# Patient Record
Sex: Female | Born: 1965
Health system: Southern US, Community
[De-identification: ages and names within clinical notes are randomized; demographics above are authoritative.]

## PROBLEM LIST (undated history)

## (undated) DIAGNOSIS — R011 Cardiac murmur, unspecified: Secondary | ICD-10-CM

## (undated) DIAGNOSIS — B029 Zoster without complications: Secondary | ICD-10-CM

## (undated) DIAGNOSIS — Z923 Personal history of irradiation: Secondary | ICD-10-CM

## (undated) DIAGNOSIS — M1990 Unspecified osteoarthritis, unspecified site: Secondary | ICD-10-CM

## (undated) DIAGNOSIS — C50911 Malignant neoplasm of unspecified site of right female breast: Secondary | ICD-10-CM

## (undated) DIAGNOSIS — Z87442 Personal history of urinary calculi: Secondary | ICD-10-CM

## (undated) DIAGNOSIS — D0511 Intraductal carcinoma in situ of right breast: Principal | ICD-10-CM

## (undated) DIAGNOSIS — I341 Nonrheumatic mitral (valve) prolapse: Secondary | ICD-10-CM

## (undated) HISTORY — DX: Intraductal carcinoma in situ of right breast: D05.11

## (undated) HISTORY — PX: WISDOM TOOTH EXTRACTION: SHX21

## (undated) HISTORY — PX: BREAST LUMPECTOMY: SHX2

## (undated) HISTORY — DX: Zoster without complications: B02.9

---

## 2004-09-01 ENCOUNTER — Encounter: Payer: Self-pay | Admitting: Internal Medicine

## 2006-07-18 ENCOUNTER — Encounter: Payer: Self-pay | Admitting: Internal Medicine

## 2006-08-18 ENCOUNTER — Encounter: Payer: Self-pay | Admitting: Internal Medicine

## 2007-06-15 ENCOUNTER — Encounter: Payer: Self-pay | Admitting: Internal Medicine

## 2007-06-16 ENCOUNTER — Encounter: Payer: Self-pay | Admitting: Internal Medicine

## 2007-10-09 ENCOUNTER — Ambulatory Visit: Payer: Self-pay | Admitting: Internal Medicine

## 2007-10-09 DIAGNOSIS — M8580 Other specified disorders of bone density and structure, unspecified site: Secondary | ICD-10-CM

## 2007-10-09 DIAGNOSIS — R109 Unspecified abdominal pain: Secondary | ICD-10-CM

## 2007-10-10 ENCOUNTER — Encounter: Admission: RE | Admit: 2007-10-10 | Discharge: 2007-10-10 | Payer: Self-pay | Admitting: Internal Medicine

## 2008-11-18 ENCOUNTER — Ambulatory Visit: Payer: Self-pay | Admitting: Internal Medicine

## 2008-11-18 DIAGNOSIS — L255 Unspecified contact dermatitis due to plants, except food: Secondary | ICD-10-CM

## 2008-11-18 LAB — CONVERTED CEMR LAB: Pap Smear: NORMAL

## 2008-11-18 LAB — HM PAP SMEAR: HM Pap smear: NORMAL

## 2009-02-21 ENCOUNTER — Ambulatory Visit: Payer: Self-pay | Admitting: Internal Medicine

## 2009-02-21 DIAGNOSIS — B354 Tinea corporis: Secondary | ICD-10-CM | POA: Insufficient documentation

## 2009-03-12 ENCOUNTER — Ambulatory Visit: Payer: Self-pay | Admitting: Internal Medicine

## 2009-04-04 ENCOUNTER — Ambulatory Visit: Payer: Self-pay | Admitting: Internal Medicine

## 2009-04-04 DIAGNOSIS — F329 Major depressive disorder, single episode, unspecified: Secondary | ICD-10-CM

## 2009-05-22 ENCOUNTER — Ambulatory Visit: Payer: Self-pay | Admitting: Internal Medicine

## 2009-05-26 ENCOUNTER — Telehealth: Payer: Self-pay | Admitting: Internal Medicine

## 2009-05-27 ENCOUNTER — Encounter: Payer: Self-pay | Admitting: Internal Medicine

## 2009-05-28 ENCOUNTER — Telehealth: Payer: Self-pay | Admitting: Internal Medicine

## 2009-07-02 ENCOUNTER — Telehealth: Payer: Self-pay | Admitting: Internal Medicine

## 2009-07-23 ENCOUNTER — Emergency Department (HOSPITAL_COMMUNITY): Admission: EM | Admit: 2009-07-23 | Discharge: 2009-07-24 | Payer: Self-pay | Admitting: Emergency Medicine

## 2009-07-24 ENCOUNTER — Encounter: Payer: Self-pay | Admitting: Internal Medicine

## 2009-10-23 ENCOUNTER — Ambulatory Visit: Payer: Self-pay | Admitting: Internal Medicine

## 2010-01-20 ENCOUNTER — Encounter: Payer: Self-pay | Admitting: Internal Medicine

## 2010-02-04 ENCOUNTER — Encounter: Payer: Self-pay | Admitting: Internal Medicine

## 2010-04-21 NOTE — Assessment & Plan Note (Signed)
Summary: 4-6 WK FU--D/T---STC   Vital Signs:  Patient profile:   45 year old female Height:      68 inches (172.72 cm) Weight:      140.0 pounds (63.64 kg) O2 Sat:      96 % on Room air Temp:     99.0 degrees F (37.22 degrees C) oral Pulse rate:   74 / minute BP sitting:   118 / 88  (left arm) Cuff size:   regular  Vitals Entered By: Orlan Leavens (May 22, 2009 8:24 AM)  O2 Flow:  Room air CC: 4 week f/u  Is Patient Diabetic? No Pain Assessment Patient in pain? no        Primary Care Provider:  Newt Lukes MD  CC:  4 week f/u .  History of Present Illness: here for followup depression - feels better - less irritability tol effexor well -  no hot flashed since starting med!  Current Medications (verified): 1)  Lotrisone 1-0.05 % Crea (Clotrimazole-Betamethasone) .... Apply Two Times A Day To Affected Area 2)  Effexor Xr 37.5 Mg Xr24h-Cap (Venlafaxine Hcl) .Marland Kitchen.. 1 By Mouth Once Daily X 2 Weeks, Then 2 Caps Once Daily or As Directed  Allergies (verified): No Known Drug Allergies  Past History:  Past Medical History: Osteopenia menopausal syndrome history of MVP, and palpitations depression  Review of Systems  The patient denies chest pain and headaches.    Physical Exam  General:  alert, well-developed, well-nourished, and cooperative to examination.    Psych:  Oriented X3, memory intact for recent and remote, normally interactive, good eye contact, and not anxious appearing.  brighter affect than last visit, less depressed, not tearful   Impression & Recommendations:  Problem # 1:  DEPRESSION (ICD-311) Assessment Improved cont same - has also improved meopausal syndrome symptoms  discussion re: eventual taper - anticipate in next 4-6 weeks so cont low dose cap in anticipation of taper Her updated medication list for this problem includes:    Effexor Xr 37.5 Mg Xr24h-cap (Venlafaxine hcl) .Marland Kitchen... 2 caps once daily or as directed for  taper  Complete Medication List: 1)  Lotrisone 1-0.05 % Crea (Clotrimazole-betamethasone) .... Apply two times a day to affected area 2)  Effexor Xr 37.5 Mg Xr24h-cap (Venlafaxine hcl) .... 2 caps once daily or as directed for taper  Patient Instructions: 1)  it was good to see you today.  2)  continue medication as we discussed -refills sent - Prescriptions: EFFEXOR XR 37.5 MG XR24H-CAP (VENLAFAXINE HCL) 2 caps once daily or as directed for taper  #60 x 2   Entered and Authorized by:   Newt Lukes MD   Signed by:   Newt Lukes MD on 05/22/2009   Method used:   Electronically to        [facility] [address] [phone]* (retail)       [address]       [place], [place]  [address]       Ph: [phone] or [phone]       Fax: [phone]   RxID:   [id number]

## 2010-04-21 NOTE — Letter (Signed)
[facility]   Imported By: Sherian Rein 02/10/2010 11:27:24  _____________________________________________________________________  External Attachment:    Type:   Image     Comment:   External Document

## 2010-04-21 NOTE — Letter (Signed)
Summary: [facility]   Imported By: Sherian Rein 01/28/2010 09:58:14  _____________________________________________________________________  External Attachment:    Type:   Image     Comment:   External Document

## 2010-04-21 NOTE — Letter (Signed)
Summary: [facility]   Imported By: Sherian Rein 07/29/2009 13:38:57  _____________________________________________________________________  External Attachment:    Type:   Image     Comment:   External Document

## 2010-04-21 NOTE — Progress Notes (Signed)
Summary: Poison Ivy  Phone Note Call from Patient Call back at Work Phone [phone]   Caller: Patient Summary of Call: pt called stating that she has poison ivy rash on arms and hands. pt is requesting Rx for oral steroids to pharmacy on file. Initial call taken by: Margaret Pyle, CMA,  July 02, 2009 8:52 AM  Follow-up for Phone Call        ok - [facility] Follow-up by: Newt Lukes MD,  July 02, 2009 9:13 AM  Additional Follow-up for Phone Call Additional follow up Details #1::        pt informed via personal VM Additional Follow-up by: Margaret Pyle, CMA,  July 02, 2009 9:32 AM    New/Updated Medications: PREDNISONE (PAK) 10 MG TABS (PREDNISONE) as directed x 6 days Prescriptions: PREDNISONE (PAK) 10 MG TABS (PREDNISONE) as directed x 6 days  #1 x 1   Entered and Authorized by:   Newt Lukes MD   Signed by:   Newt Lukes MD on 07/02/2009   Method used:   Electronically to        [facility] [address] [phone]* (retail)       [address]       [place], [place]  [address]       Ph: [phone] or [phone]       Fax: [phone]   RxID:   [id number]

## 2010-04-21 NOTE — Assessment & Plan Note (Signed)
Summary: "private" matter/will discuss w/dr leshcber/cd   Vital Signs:  Patient profile:   45 year old female Height:      68 inches (172.72 cm) Weight:      144.0 pounds (65.45 kg) O2 Sat:      96 % on Room air Temp:     98.3 degrees F (36.83 degrees C) oral Pulse rate:   67 / minute BP sitting:   110 / 82  (left arm) Cuff size:   regular  Vitals Entered By: Orlan Leavens RMA (October 23, 2009 9:10 AM)  O2 Flow:  Room air CC: Discuss privates iissue with md Is Patient Diabetic? No Pain Assessment Patient in pain? no        Primary Care Provider:  Newt Lukes MD  CC:  Discuss privates iissue with md.  History of Present Illness: here for c/o itch and burn- onset weeks ago-   Current Medications (verified): 1)  None  Allergies (verified): No Known Drug Allergies  Past History:  Past Medical History: Osteopenia menopausal syndrome history of MVP, and palpitations depression kidney stone 07/2009  Md roster: uro - gyn -sylva  Review of Systems  The patient denies fever, weight loss, and hematochezia.    Physical Exam  General:  alert, well-developed, well-nourished, and cooperative to examination.    Mouth:  pharynx pink and moist.   Rectal:  no external abnormalities, no hemorrhoids, normal sphincter tone, and no masses.   Skin:  skin tear above anus, candida changes Psych:  Oriented X3, memory intact for recent and remote, normally interactive, good eye contact, and not anxious appearing.  not depressed, not tearful   Impression & Recommendations:  Problem # 1:  TINEA CORPORIS (ICD-110.5)  add nystatin powder and use lotrisone (left from prior rx) as needed   Orders: Prescription Created Electronically [phone])  Complete Medication List: 1)  Nystop 100000 Unit/gm Powd (Nystatin) .... Apply to affected skin as needed for yeast/itch  Patient Instructions: 1)  it was good to see you today. 2)  use cream and powders as discussed - 3)  Align  daily for 1 month to see if this helps anything - 4)  Please schedule a follow-up appointment as needed. Prescriptions: NYSTOP 100000 UNIT/GM POWD (NYSTATIN) apply to affected skin as needed for yeast/itch  #1 small x 1   Entered and Authorized by:   Newt Lukes MD   Signed by:   Newt Lukes MD on 10/23/2009   Method used:   Electronically to        [facility]* (retail)       [address]       [address] Shipping/mailing       [place], [place]  [address]       Ph: [phone]       Fax: [phone]   RxID:   [id number]

## 2010-04-21 NOTE — Progress Notes (Signed)
SummaryKatharine Look PA  Phone Note From Pharmacy   Caller: [facility] Call For: ID:  [id number]  Summary of Call: PA request--Venlafaxine ER (generic Effexor). Per [email or web address] this cannot be done online and our office will receive fax criteria. Initial call taken by: Lucious Groves,  May 26, 2009 3:26 PM  Follow-up for Phone Call        Form was never rec'd so I logged into [email or web address] again and case was available for completion online. Approved until 2012. Follow-up by: Lucious Groves,  May 27, 2009 11:48 AM  Additional Follow-up for Phone Call Additional follow up Details #1::        ok - noted - thx Additional Follow-up by: Newt Lukes MD,  May 27, 2009 11:49 AM

## 2010-04-21 NOTE — Progress Notes (Signed)
Summary: PA  Phone Note Other Incoming   Summary of Call: received [facility] for prior auth Pt ID # [id number] [employer] [phone] Initial call taken by: Ami Bullins CMA,  May 28, 2009 11:43 AM  Follow-up for Phone Call        spoke with [employer] anitiated a PA, pt is cover and her coverage is from 05/05/2009 to 05/27/2010. I am waiting for form to be faxed Follow-up by: Ami Bullins CMA,  May 28, 2009 2:39 PM  Additional Follow-up for Phone Call Additional follow up Details #1::        prescription already approved. Closed phone note. Additional Follow-up by: Lucious Groves,  May 30, 2009 1:13 PM

## 2010-04-21 NOTE — Assessment & Plan Note (Signed)
Summary: fu---stc   Vital Signs:  Patient profile:   45 year old female Height:      68 inches (172.72 cm) Weight:      144.0 pounds (65.45 kg) O2 Sat:      99 % on Room air Temp:     98.1 degrees F (36.72 degrees C) oral Pulse rate:   75 / minute BP sitting:   102 / 82  (left arm) Cuff size:   regular  Vitals Entered By: Orlan Leavens (April 04, 2009 4:07 PM)  O2 Flow:  Room air CC: follow-up visit Is Patient Diabetic? No Pain Assessment Patient in pain? no        Primary Care Provider:  Newt Lukes MD  CC:  follow-up visit.  History of Present Illness: c/o depression symptoms  onset was several mos ago but course has become progressively worse symptoms present daily charchterized by short fuse temperment and irritability of mood generalized sadness also poor sleep - intermittent rest spells with freq waking - c/o tearful spells exac by recent stressors including death of dad about 18mos ago, move to new area approx 16 mos ago, and moving away of only close friend in area denies si/hi no hx same  Current Medications (verified): 1)  Lotrisone 1-0.05 % Crea (Clotrimazole-Betamethasone) .... Apply Two Times A Day To Affected Area  Allergies (verified): No Known Drug Allergies  Past History:  Past Medical History: Last updated: 10/09/2007 Osteopenia menopausal syndrome history of MVP, and palpitations  Review of Systems  The patient denies anorexia, weight loss, chest pain, syncope, and headaches.    Physical Exam  General:  alert, well-developed, well-nourished, and cooperative to examination.    Lungs:  normal respiratory effort, no intercostal retractions or use of accessory muscles; normal breath sounds bilaterally - no crackles and no wheezes.    Heart:  normal rate, regular rhythm, no murmur, and no rub. BLE without edema. Psych:  Oriented X3, depressed affect, and tearful.     Impression & Recommendations:  Problem # 1:  DEPRESSION  (ICD-311)  clinical symptoms present has initiated return to counseling for support with intent to continue q2-4 weeks will start snri tx (note also possible benefit and support for mgmt of hot flash symptoms) educated on risk, benefits and poss se to f/u 4-6 wks, sooner if probs Her updated medication list for this problem includes:    Effexor Xr 37.5 Mg Xr24h-cap (Venlafaxine hcl) .Marland Kitchen... 1 by mouth once daily x 2 weeks, then 2 caps once daily or as directed  Orders: Prescription Created Electronically [phone])  Complete Medication List: 1)  Lotrisone 1-0.05 % Crea (Clotrimazole-betamethasone) .... Apply two times a day to affected area 2)  Effexor Xr 37.5 Mg Xr24h-cap (Venlafaxine hcl) .Marland Kitchen.. 1 by mouth once daily x 2 weeks, then 2 caps once daily or as directed  Patient Instructions: 1)  it was good to see you today.  2)  start effexor as discussed - your prescription has been electronically submitted to your pharmacy. Please take as directed. Contact our office if you believe you're having problems with the medication(s).  3)  Please schedule a follow-up appointment in 4-6 weeks, sooner if problems.  Prescriptions: EFFEXOR XR 37.5 MG XR24H-CAP (VENLAFAXINE HCL) 1 by mouth once daily x 2 weeks, then 2 caps once daily or as directed  #60 x 1   Entered and Authorized by:   Newt Lukes MD   Signed by:   Newt Lukes MD on 04/04/2009  Method used:   Electronically to        [facility]* (retail)       [address]       [address] Shipping/mailing       [place], [place]  [address]       Ph: [phone]       Fax: [phone]   RxID:   [id number]

## 2010-04-21 NOTE — Medication Information (Signed)
Summary: Venlafaxine Approved/[facility]  Venlafaxine Approved/[facility]   Imported By: Sherian Rein 06/25/2009 09:57:05  _____________________________________________________________________  External Attachment:    Type:   Image     Comment:   External Document

## 2010-06-09 LAB — URINALYSIS, ROUTINE W REFLEX MICROSCOPIC
Glucose, UA: NEGATIVE mg/dL
Nitrite: NEGATIVE
Specific Gravity, Urine: 1.017 (ref 1.005–1.030)
pH: 7 (ref 5.0–8.0)

## 2010-06-09 LAB — DIFFERENTIAL
Basophils Absolute: 0 10*3/uL (ref 0.0–0.1)
Basophils Relative: 0 % (ref 0–1)
Lymphocytes Relative: 28 % (ref 12–46)
Monocytes Relative: 6 % (ref 3–12)

## 2010-06-09 LAB — CBC
HCT: 39.4 % (ref 36.0–46.0)
MCHC: 35.3 g/dL (ref 30.0–36.0)
MCV: 94.6 fL (ref 78.0–100.0)
Platelets: 186 10*3/uL (ref 150–400)
RDW: 12.7 % (ref 11.5–15.5)

## 2010-06-09 LAB — COMPREHENSIVE METABOLIC PANEL
Alkaline Phosphatase: 83 U/L (ref 39–117)
BUN: 15 mg/dL (ref 6–23)
Calcium: 9.2 mg/dL (ref 8.4–10.5)
Creatinine, Ser: 0.8 mg/dL (ref 0.4–1.2)
GFR calc Af Amer: >60 mL/min (ref >60)
GFR calc non Af Amer: >60 mL/min (ref >60)
Glucose, Bld: 136 mg/dL — ABNORMAL HIGH (ref 70–99)
Potassium: 3.7 mEq/L (ref 3.5–5.1)
Total Protein: 7 g/dL (ref 6.0–8.3)

## 2010-06-09 LAB — LIPASE, BLOOD: Lipase: 27 U/L (ref 11–59)

## 2010-09-03 ENCOUNTER — Encounter: Payer: Self-pay | Admitting: Internal Medicine

## 2010-11-16 ENCOUNTER — Telehealth: Payer: Self-pay | Admitting: *Deleted

## 2010-11-16 DIAGNOSIS — Z0000 Encounter for general adult medical examination without abnormal findings: Secondary | ICD-10-CM

## 2010-11-16 NOTE — Telephone Encounter (Signed)
Received staff msg pt made cpx for 11/30/10 need physical labs put in computer. Enter labs into [employer].Marland KitchenMarland Kitchen8/27/12@3 :39pm/LMB

## 2010-11-30 ENCOUNTER — Other Ambulatory Visit (INDEPENDENT_AMBULATORY_CARE_PROVIDER_SITE_OTHER): Payer: 59

## 2010-11-30 ENCOUNTER — Other Ambulatory Visit: Payer: Self-pay | Admitting: Internal Medicine

## 2010-11-30 ENCOUNTER — Ambulatory Visit (INDEPENDENT_AMBULATORY_CARE_PROVIDER_SITE_OTHER): Payer: 59 | Admitting: Internal Medicine

## 2010-11-30 ENCOUNTER — Encounter: Payer: Self-pay | Admitting: Internal Medicine

## 2010-11-30 VITALS — BP 112/80 | HR 66 | Temp 98.5°F | Resp 18 | Wt 144.8 lb

## 2010-11-30 DIAGNOSIS — Z23 Encounter for immunization: Secondary | ICD-10-CM

## 2010-11-30 DIAGNOSIS — Z0000 Encounter for general adult medical examination without abnormal findings: Secondary | ICD-10-CM

## 2010-11-30 DIAGNOSIS — R1011 Right upper quadrant pain: Secondary | ICD-10-CM

## 2010-11-30 LAB — URINALYSIS, ROUTINE W REFLEX MICROSCOPIC
Hgb urine dipstick: NEGATIVE
Nitrite: NEGATIVE
Specific Gravity, Urine: 1.015 (ref 1.000–1.030)
Urine Glucose: NEGATIVE
Urobilinogen, UA: 0.2 (ref 0.0–1.0)
pH: 7.5 (ref 5.0–8.0)

## 2010-11-30 LAB — CBC WITH DIFFERENTIAL/PLATELET
Basophils Absolute: 0 10*3/uL (ref 0.0–0.1)
Hemoglobin: 13.3 g/dL (ref 12.0–15.0)
Lymphocytes Relative: 24.9 % (ref 12.0–46.0)
Lymphs Abs: 1.8 10*3/uL (ref 0.7–4.0)
MCV: 93.4 fl (ref 78.0–100.0)
Neutro Abs: 4.9 10*3/uL (ref 1.4–7.7)
Platelets: 187 10*3/uL (ref 150.0–400.0)
RDW: 12.8 % (ref 11.5–14.6)

## 2010-11-30 NOTE — Progress Notes (Signed)
Subjective:    Patient ID: Rebekah Larson, female    DOB: 02-03-1966, 45 y.o.   MRN: [id number]  HPI patient is here today for annual physical. Patient feels well today.  Notes 3 weeks of vague RUQ/epigastric "pressure" No nausea and vomiting or fever, not changed with position, activity or foods/hunger No radiation to back or pelvis No med changes or trauma  Past Medical History  Diagnosis Date  . DEPRESSION   . OSTEOPENIA   . TINEA CORPORIS   . Menopausal syndrome   . Kidney stone 07/2009   Family History  Problem Relation Age of Onset  . Cancer Father    History  Substance Use Topics  . Smoking status: Never Smoker   . Smokeless tobacco: Not on file   Comment: Married. Husband is COO  health system. student at [employer] - grad planned 07/2011  . Alcohol Use: No    Review of Systems Constitutional: Negative for fever or unexpected weight gain.  Respiratory: Negative for cough and shortness of breath.   Cardiovascular: Negative for chest pain or palpitations.  Gastrointestinal: Negative for nausea and vomiting, or bowel movement changes.  Musculoskeletal: Negative for gait problem or swelling.  Skin: Negative for rash.  Neurological: Negative for dizziness or headache.  No other specific complaints in a complete review of systems (except as listed in HPI above).     Objective:   Physical Exam VS T 98.5  BP 112/80  P 66 R 18--97%RA Constitutional: She fit and appears well-developed and well-nourished. No distress.  HENT: Head: Normocephalic and atraumatic. Ears: B TMs ok, no erythema or effusion; Nose: Nose normal.  Mouth/Throat: Oropharynx is clear and moist. No oropharyngeal exudate.  Eyes: Conjunctivae and EOM are normal. Pupils are equal, round, and reactive to light. No scleral icterus.  Neck: Normal range of motion. Neck supple. No JVD present. No thyromegaly present.  Cardiovascular: Normal rate, regular rhythm and normal heart sounds.  No murmur  heard. No BLE edema. Pulmonary/Chest: Effort normal and breath sounds normal. No respiratory distress. She has no wheezes.  Abdominal: Soft. Bowel sounds are normal. She exhibits no distension. There is no tenderness. no masses Musculoskeletal: Normal range of motion, no joint effusions. No gross deformities Neurological: She is alert and oriented to person, place, and time. No cranial nerve deficit. Coordination normal.  Skin: Skin is warm and dry. No rash noted. No erythema.  Psychiatric: She has a normal mood and affect. Her behavior is normal. Judgment and thought content normal.   Lab Results  Component Value Date   WBC 9.1 07/23/2009   HGB 13.9 07/23/2009   HCT 39.4 07/23/2009   PLT 186 07/23/2009   ALT 27 07/23/2009   AST 32 07/23/2009   NA 141 07/23/2009   K 3.7 07/23/2009   CL 106 07/23/2009   CREATININE 0.80 07/23/2009   BUN 15 07/23/2009   CO2 30 07/23/2009       Assessment & Plan:  CPX - v70.0 - Patient has been counseled on age-appropriate routine health concerns for screening and prevention. These are reviewed and up-to-date. Immunizations are up-to-date or declined. Labs and ECG reviewed.  Also RUQ/epigatric pain - ongoing x 3 weeks - prior CT without cm 07/2009 and [place] 09/2007 reviewed (unremarkable) - not reproducible on exam or with activity/food - hx kidney stones, always "felt in front, not flank" - no other red flags on hx - will check CT a/p to look for underlying problems as well as review CPX labs  as ordered here

## 2010-11-30 NOTE — Patient Instructions (Signed)
It was good to see you today. EKG and exam look good today - keep up the good work! Test(s) ordered today. Your results will be called to you after review (48-72hours after test completion). If any changes need to be made, you will be notified at that time. Tdap and flu shots done today we'll make referral for CT of abdomen due to pain. Our office will contact you regarding appointment(s) once made. Please schedule followup annually, call sooner if problems.

## 2010-12-01 LAB — BASIC METABOLIC PANEL
BUN: 15 mg/dL (ref 6–23)
Calcium: 9.3 mg/dL (ref 8.4–10.5)
Creatinine, Ser: 0.8 mg/dL (ref 0.4–1.2)
Glucose, Bld: 93 mg/dL (ref 70–99)
Sodium: 143 mEq/L (ref 135–145)

## 2010-12-01 LAB — LIPID PANEL
HDL: 79.4 mg/dL (ref >39.00)
LDL Cholesterol: 104 mg/dL — ABNORMAL HIGH (ref 0–99)
Total CHOL/HDL Ratio: 3
VLDL: 16.8 mg/dL (ref 0.0–40.0)

## 2010-12-01 LAB — TSH: TSH: 0.8 u[IU]/mL (ref 0.35–5.50)

## 2010-12-01 LAB — HEPATIC FUNCTION PANEL
ALT: 17 U/L (ref 0–35)
Alkaline Phosphatase: 70 U/L (ref 39–117)
Bilirubin, Direct: 0.1 mg/dL (ref 0.0–0.3)
Total Protein: 6.9 g/dL (ref 6.0–8.3)

## 2010-12-04 ENCOUNTER — Ambulatory Visit (INDEPENDENT_AMBULATORY_CARE_PROVIDER_SITE_OTHER)
Admission: RE | Admit: 2010-12-04 | Discharge: 2010-12-04 | Disposition: A | Discharge status: Home / Self Care | Payer: 59 | Source: Ambulatory Visit | Attending: Internal Medicine | Admitting: Internal Medicine

## 2010-12-04 DIAGNOSIS — R1011 Right upper quadrant pain: Secondary | ICD-10-CM

## 2010-12-04 MED ORDER — IOHEXOL 300 MG/ML  SOLN
100.0000 mL | Freq: Once | INTRAMUSCULAR | Status: AC | PRN
  Administered 2010-12-04: 100 mL via INTRAVENOUS

## 2011-02-24 ENCOUNTER — Telehealth: Payer: Self-pay | Admitting: *Deleted

## 2011-02-24 MED ORDER — OSELTAMIVIR PHOSPHATE 75 MG PO CAPS: 75.0000 mg | ORAL_CAPSULE | Freq: Two times a day (BID) | ORAL | Status: AC

## 2011-02-24 NOTE — Telephone Encounter (Signed)
Pt states she has been having flu like sxs for 2 days (fever of 100+, body aches, somewhat productive cough, HA) pt wants to know if Tamiflu can be called in or if she needs OV. Per verbal from VAL, okay for Tamiflu rx, and to come in for OV if sxs do not improve, pt informed. Rx sent to [facility].

## 2011-08-30 ENCOUNTER — Encounter: Payer: Self-pay | Admitting: Internal Medicine

## 2011-08-30 ENCOUNTER — Ambulatory Visit (INDEPENDENT_AMBULATORY_CARE_PROVIDER_SITE_OTHER): Payer: PRIVATE HEALTH INSURANCE | Admitting: Internal Medicine

## 2011-08-30 VITALS — BP 102/80 | HR 77 | Temp 98.4°F | Ht 68.0 in | Wt 141.1 lb

## 2011-08-30 DIAGNOSIS — Z23 Encounter for immunization: Secondary | ICD-10-CM

## 2011-08-30 DIAGNOSIS — Z0289 Encounter for other administrative examinations: Secondary | ICD-10-CM

## 2011-08-30 DIAGNOSIS — Z020 Encounter for examination for admission to educational institution: Secondary | ICD-10-CM

## 2011-08-30 DIAGNOSIS — Z111 Encounter for screening for respiratory tuberculosis: Secondary | ICD-10-CM

## 2011-08-30 NOTE — Patient Instructions (Signed)
It was good to see you today. Forms for employment completed today - return in 48h for PPD reading (Wed 09/01/11) and to pick up forms MMR update today we will send to your prior provider(s) for "release of records" for immunization history as discussed today -

## 2011-08-30 NOTE — Progress Notes (Signed)
  Subjective:    Patient ID: Rebekah Larson, female    DOB: 12-27-1965, 46 y.o.   MRN: [id number]  HPI here for completion of school form and TB test ?Hep B and MMR  Past Medical History  Diagnosis Date  . DEPRESSION   . OSTEOPENIA   . TINEA CORPORIS   . Menopausal syndrome   . Kidney stone 07/2009    Review of Systems  Constitutional: Negative for fever and fatigue.  HENT: Negative for hearing loss and ear pain.   Respiratory: Negative for cough and shortness of breath.   Cardiovascular: Negative for chest pain and palpitations.  Neurological: Negative for numbness and headaches.       Objective:   Physical Exam BP 102/80  Pulse 77  Temp(Src) 98.4 F (36.9 C) (Oral)  Ht 5\' 8"  (1.727 m)  Wt 141 lb 1.9 oz (64.012 kg)  BMI 21.46 kg/m2  SpO2 97% Constitutional: She appears well-developed and well-nourished. No distress.  HENT: Head: Normocephalic and atraumatic. Ears: B TMs ok, no erythema or effusion; Nose: Nose normal. Mouth/Throat: Oropharynx is clear and moist. No oropharyngeal exudate.  Eyes: Conjunctivae and EOM are normal. Pupils are equal, round, and reactive to light. No scleral icterus.  Neck: Normal range of motion. Neck supple. No JVD present. No thyromegaly present.  Cardiovascular: Normal rate, regular rhythm and normal heart sounds.  No murmur heard. No BLE edema. Pulmonary/Chest: Effort normal and breath sounds normal. No respiratory distress. She has no wheezes.  Musculoskeletal: Normal range of motion, no joint effusions. No gross deformities Neurological: She is alert and oriented to person, place, and time. No cranial nerve deficit. Coordination normal.  Skin: Skin is warm and dry. No rash noted. No erythema.  Psychiatric: She has a normal mood and affect. Her behavior is normal. Judgment and thought content normal.   Lab Results  Component Value Date   WBC 7.2 11/30/2010   HGB 13.3 11/30/2010   HCT 38.7 11/30/2010   PLT 187.0 11/30/2010   GLUCOSE 93  11/30/2010   CHOL 200 11/30/2010   TRIG 84.0 11/30/2010   HDL 79.40 11/30/2010   LDLCALC 104* 11/30/2010   ALT 17 11/30/2010   AST 22 11/30/2010   NA 143 11/30/2010   K 4.0 11/30/2010   CL 104 11/30/2010   CREATININE 0.8 11/30/2010   BUN 15 11/30/2010   CO2 30 11/30/2010   TSH 0.80 11/30/2010       Assessment & Plan:  Forms completed for school employment -  Update MMR and send for ROI for Hep B series - vaccinate if unavailable PPD placed today

## 2011-09-01 LAB — TB SKIN TEST
Induration: 0 mm
TB Skin Test: NEGATIVE

## 2011-09-14 ENCOUNTER — Encounter: Payer: Self-pay | Admitting: Internal Medicine

## 2012-03-07 ENCOUNTER — Telehealth: Payer: Self-pay | Admitting: Internal Medicine

## 2012-03-07 MED ORDER — OSELTAMIVIR PHOSPHATE 75 MG PO CAPS: 75.0000 mg | ORAL_CAPSULE | Freq: Every day | ORAL | Status: AC

## 2012-03-07 NOTE — Telephone Encounter (Signed)
Contact with proven influenza-- close contact (son) Chemoprophylaxis discussed with husbancd

## 2012-10-30 ENCOUNTER — Encounter: Payer: Self-pay | Admitting: Internal Medicine

## 2012-10-30 ENCOUNTER — Other Ambulatory Visit: Payer: PRIVATE HEALTH INSURANCE

## 2012-10-30 ENCOUNTER — Ambulatory Visit (INDEPENDENT_AMBULATORY_CARE_PROVIDER_SITE_OTHER): Payer: PRIVATE HEALTH INSURANCE | Admitting: Internal Medicine

## 2012-10-30 VITALS — BP 112/80 | HR 70 | Temp 98.5°F

## 2012-10-30 DIAGNOSIS — J029 Acute pharyngitis, unspecified: Secondary | ICD-10-CM

## 2012-10-30 DIAGNOSIS — Z124 Encounter for screening for malignant neoplasm of cervix: Secondary | ICD-10-CM

## 2012-10-30 DIAGNOSIS — J309 Allergic rhinitis, unspecified: Secondary | ICD-10-CM

## 2012-10-30 MED ORDER — DESOXIMETASONE 0.25 % EX CREA: TOPICAL_CREAM | Freq: Two times a day (BID) | CUTANEOUS | Status: DC | PRN

## 2012-10-30 MED ORDER — CETIRIZINE HCL 10 MG PO TABS: 10.0000 mg | ORAL_TABLET | Freq: Every day | ORAL | Status: DC

## 2012-10-30 MED ORDER — IBUPROFEN 200 MG PO TABS: 200.0000 mg | ORAL_TABLET | Freq: Four times a day (QID) | ORAL | Status: DC | PRN

## 2012-10-30 MED ORDER — PSEUDOEPHEDRINE HCL 30 MG PO TABS: 30.0000 mg | ORAL_TABLET | ORAL | Status: DC | PRN

## 2012-10-30 NOTE — Patient Instructions (Signed)
It was good to see you today. We have reviewed your prior records including labs and tests today Test(s) ordered today. Your results will be released to MyChart (or called to you) after review, usually within 72hours after test completion. If any changes need to be made, you will be notified at that same time. Medications reviewed and updated Use Zyrtec or Claritin 10 mg once daily for allergy symptoms, okay for ibuprofen as needed for aches and pains and Sudafed as needed for congestion If symptoms unimproved with treatment for allergies and if testing is unremarkable, call for further evaluation as needed We'll make referral to Dr. Thana Ates as discussed for gynecology care as discussed  Allergic Rhinitis Allergic rhinitis is when the mucous membranes in the nose respond to allergens. Allergens are particles in the air that cause your body to have an allergic reaction. This causes you to release allergic antibodies. Through a chain of events, these eventually cause you to release histamine into the blood stream (hence the use of antihistamines). Although meant to be protective to the body, it is this release that causes your discomfort, such as frequent sneezing, congestion and an itchy runny nose.  CAUSES  The pollen allergens may come from grasses, trees, and weeds. This is seasonal allergic rhinitis, or "hay fever." Other allergens cause year-round allergic rhinitis (perennial allergic rhinitis) such as house dust mite allergen, pet dander and mold spores.  SYMPTOMS   Nasal stuffiness (congestion).  Runny, itchy nose with sneezing and tearing of the eyes.  There is often an itching of the mouth, eyes and ears. It cannot be cured, but it can be controlled with medications. DIAGNOSIS  If you are unable to determine the offending allergen, skin or blood testing may find it. TREATMENT   Avoid the allergen.  Medications and allergy shots (immunotherapy) can help.  Hay fever may often be  treated with antihistamines in pill or nasal spray forms. Antihistamines block the effects of histamine. There are over-the-counter medicines that may help with nasal congestion and swelling around the eyes. Check with your caregiver before taking or giving this medicine. If the treatment above does not work, there are many new medications your caregiver can prescribe. Stronger medications may be used if initial measures are ineffective. Desensitizing injections can be used if medications and avoidance fails. Desensitization is when a patient is given ongoing shots until the body becomes less sensitive to the allergen. Make sure you follow up with your caregiver if problems continue. SEEK MEDICAL CARE IF:   You develop fever (more than 100.5 F (38.1 C).  You develop a cough that does not stop easily (persistent).  You have shortness of breath.  You start wheezing.  Symptoms interfere with normal daily activities. Document Released: 12/01/2000 Document Revised: 05/31/2011 Document Reviewed: 06/12/2008 [facility] Patient Information 2014 [place], [place].

## 2012-10-30 NOTE — Progress Notes (Signed)
Subjective:    Patient ID: Rebekah Larson, female    DOB: 09-09-1965, 47 y.o.   MRN: [id number]  Sore Throat  This is a new problem. The current episode started more than 1 month ago. The problem has been waxing and waning. Neither side of throat is experiencing more pain than the other. There has been no fever. The pain is at a severity of 5/10. The pain is moderate. Associated symptoms include congestion and coughing (dry). Pertinent negatives include no abdominal pain, diarrhea, ear discharge, ear pain, headaches, hoarse voice, plugged ear sensation, neck pain, shortness of breath, swollen glands, trouble swallowing or vomiting. Strep: ? She has tried nothing for the symptoms. The treatment provided no relief.   Past Medical History  Diagnosis Date  . DEPRESSION   . OSTEOPENIA   . TINEA CORPORIS   . Menopausal syndrome   . Kidney stone 07/2009    Review of Systems  Constitutional: Positive for fatigue. Negative for fever and unexpected weight change.  HENT: Positive for congestion, sneezing, postnasal drip and sinus pressure. Negative for ear pain, hoarse voice, facial swelling, trouble swallowing, neck pain and ear discharge.   Respiratory: Positive for cough (dry). Negative for shortness of breath.   Gastrointestinal: Negative for vomiting, abdominal pain and diarrhea.  Musculoskeletal: Positive for myalgias. Negative for joint swelling and arthralgias.  Skin: Negative for rash and wound.  Neurological: Negative for headaches.       Objective:   Physical Exam BP 112/80  Pulse 70  Temp(Src) 98.5 F (36.9 C) (Oral)  SpO2 97% Wt Readings from Last 3 Encounters:  08/30/11 141 lb 1.9 oz (64.012 kg)  11/30/10 144 lb 12.8 oz (65.681 kg)  10/23/09 144 lb (65.318 kg)   Constitutional: She appears well-developed and well-nourished. No distress.  HENT: Head: Normocephalic and atraumatic. Ears: B TMs ok, no erythema or effusion; Nose: clear rhinorrhea, erythematous turbinates with  minimal swelling, no exudate. Mouth/Throat: Oropharynx is clear and moist. No oropharyngeal exudate.  Eyes: Conjunctivae and EOM are normal. Pupils are equal, round, and reactive to light. No scleral icterus.  Neck: Normal range of motion. Neck supple. No JVD present. No thyromegaly present.  Cardiovascular: Normal rate, regular rhythm and normal heart sounds.  No murmur heard. No BLE edema. Pulmonary/Chest: Effort normal and breath sounds normal. No respiratory distress. She has no wheezes.  Skin: Skin is warm and dry. No rash noted. No erythema.  Psychiatric: She has a normal mood and affect. Her behavior is normal. Judgment and thought content normal.   Lab Results  Component Value Date   WBC 7.2 11/30/2010   HGB 13.3 11/30/2010   HCT 38.7 11/30/2010   PLT 187.0 11/30/2010   GLUCOSE 93 11/30/2010   CHOL 200 11/30/2010   TRIG 84.0 11/30/2010   HDL 79.40 11/30/2010   LDLCALC 104* 11/30/2010   ALT 17 11/30/2010   AST 22 11/30/2010   NA 143 11/30/2010   K 4.0 11/30/2010   CL 104 11/30/2010   CREATININE 0.8 11/30/2010   BUN 15 11/30/2010   CO2 30 11/30/2010   TSH 0.80 11/30/2010      Assessment & Plan:   sore throat  allergic rhinitis   No clear evidence for infection, rapid strep negative but sent for culture If group A present on culture, will treat with antibiotics Until then, hold empiric antibiotics and treat for allergic rhinitis symptoms -  Over-the-counter antihistamine Also check CBC and consider Antibiotic treatment if leukocytosis or left shift Decongestants and anti-inflammatory  ibuprofen okay prn

## 2012-10-31 ENCOUNTER — Telehealth: Payer: Self-pay | Admitting: Internal Medicine

## 2012-10-31 ENCOUNTER — Other Ambulatory Visit: Payer: Self-pay | Admitting: Internal Medicine

## 2012-10-31 NOTE — Telephone Encounter (Signed)
I LMOM (cell) re: PA for Topicort  A) option to change to 1) generic Diprolene 0.05% (cream or ointment) or 2) generic Lidex 0.05% (cream or oint) B) If already tried and failed one of these 2 meds, let us know so we may proceed with PA override  Pt given office number to call for response - or encouraged to use MyChart to update [place] on her response

## 2012-11-01 NOTE — Telephone Encounter (Signed)
Called pt to verify if she received md msg, and she did not. Gave her md response. Pt states pharmacy went ahead and gave her the generic diprolene instead. If doesn't work will call back to let md know,,,lmb

## 2013-02-02 ENCOUNTER — Telehealth: Payer: Self-pay | Admitting: Internal Medicine

## 2013-02-02 ENCOUNTER — Other Ambulatory Visit (INDEPENDENT_AMBULATORY_CARE_PROVIDER_SITE_OTHER): Payer: PRIVATE HEALTH INSURANCE

## 2013-02-02 DIAGNOSIS — J309 Allergic rhinitis, unspecified: Secondary | ICD-10-CM

## 2013-02-02 DIAGNOSIS — R300 Dysuria: Secondary | ICD-10-CM

## 2013-02-02 DIAGNOSIS — J029 Acute pharyngitis, unspecified: Secondary | ICD-10-CM

## 2013-02-02 LAB — CBC WITH DIFFERENTIAL/PLATELET
Basophils Relative: 0.5 % (ref 0.0–3.0)
Eosinophils Absolute: 0 10*3/uL (ref 0.0–0.7)
HCT: 38.6 % (ref 36.0–46.0)
Hemoglobin: 13.1 g/dL (ref 12.0–15.0)
Lymphs Abs: 1.9 10*3/uL (ref 0.7–4.0)
MCHC: 34 g/dL (ref 30.0–36.0)
MCV: 91.9 fl (ref 78.0–100.0)
Monocytes Absolute: 0.4 10*3/uL (ref 0.1–1.0)
Neutro Abs: 5.1 10*3/uL (ref 1.4–7.7)
RBC: 4.19 Mil/uL (ref 3.87–5.11)

## 2013-02-02 LAB — URINALYSIS, ROUTINE W REFLEX MICROSCOPIC
Specific Gravity, Urine: >=1.03 (ref 1.000–1.030)
Urine Glucose: NEGATIVE
Urobilinogen, UA: 0.2 (ref 0.0–1.0)

## 2013-02-02 MED ORDER — CIPROFLOXACIN HCL 500 MG PO TABS: 500.0000 mg | ORAL_TABLET | Freq: Two times a day (BID) | ORAL | Status: DC

## 2013-02-02 NOTE — Telephone Encounter (Signed)
Patient Information:  Caller Name: Kerri  Phone: [phone]  Patient: Rebekah Larson  Gender: Female  DOB: [birth year]  Age: 47 Years  PCP: Rene Paci (Adults only)  Pregnant: No  Office Follow Up:  Does the office need to follow up with this patient?: Yes  Instructions For The Office: Patient needs call back.  RN Note:  Patient is not able to get to office until atleast 4:15pm today for an appointment. But she would be glad to come drop off a urine sample. Please contact her with further instructions.  Symptoms  Reason For Call & Symptoms: Pressure/pain over bladder, feels like she cannot empty her bladder completely. Mild pain with urination. Reports urgency.  Reviewed Health History In EMR: Yes  Reviewed Medications In EMR: Yes  Reviewed Allergies In EMR: Yes  Reviewed Surgeries / Procedures: Yes  Date of Onset of Symptoms: 02/01/2013 OB / GYN:  LMP: Unknown  Guideline(s) Used:  Urination Pain - Female  Disposition Per Guideline:   See Today in Office  Reason For Disposition Reached:   Painful urination AND EITHER frequency or urgency  Advice Given:  N/A  Patient Will Follow Care Advice:  YES

## 2013-02-02 NOTE — Telephone Encounter (Signed)
Notified pt md has ordered the UA. Pt is wanting results call to her don't want to go all weekend if she has a UTI without antibiotic. Pt would like md to call her & if need antibiotic pls send into [facility]/ [address]...lmb

## 2013-02-02 NOTE — Telephone Encounter (Signed)
Call to patient on mobile as requested. Urinalysis benign. Suggested conservative care measures for bladder irritation to include acidification with cranberry juice/tablets and AZO If symptoms unimproved in next 48 hours, patient will begin empiric Cipro -erx done in case needed over weekend, but not clearly indicated at this time. If symptoms still improved by next week, patient will call for evaluation as needed Patient understands and agrees, appreciative of suggestions

## 2013-02-02 NOTE — Telephone Encounter (Signed)
Ok for UA - lab entered

## 2013-11-22 ENCOUNTER — Ambulatory Visit (INDEPENDENT_AMBULATORY_CARE_PROVIDER_SITE_OTHER): Payer: PRIVATE HEALTH INSURANCE | Admitting: Internal Medicine

## 2013-11-22 ENCOUNTER — Encounter: Payer: Self-pay | Admitting: Internal Medicine

## 2013-11-22 VITALS — BP 110/82 | HR 63 | Temp 98.4°F | Ht 68.0 in | Wt 150.1 lb

## 2013-11-22 DIAGNOSIS — L03039 Cellulitis of unspecified toe: Secondary | ICD-10-CM

## 2013-11-22 DIAGNOSIS — L03031 Cellulitis of right toe: Secondary | ICD-10-CM | POA: Insufficient documentation

## 2013-11-22 MED ORDER — DOXYCYCLINE MONOHYDRATE 100 MG PO CAPS: 100.0000 mg | ORAL_CAPSULE | Freq: Two times a day (BID) | ORAL | Status: DC

## 2013-11-22 MED ORDER — DOXYCYCLINE HYCLATE 100 MG PO TABS: 100.0000 mg | ORAL_TABLET | Freq: Two times a day (BID) | ORAL | Status: DC

## 2013-11-22 NOTE — Patient Instructions (Signed)
Please take all new medication as prescribed  Please continue all other medications as before, and refills have been done if requested.  Please have the pharmacy call with any other refills you may need.  Please keep your appointments with your specialists as you may have planned     

## 2013-11-22 NOTE — Progress Notes (Signed)
Pre visit review using our clinic review tool, if applicable. No additional management support is needed unless otherwise documented below in the visit note. 

## 2013-11-24 NOTE — Progress Notes (Signed)
   Subjective:    Patient ID: Rebekah Larson, female    DOB: [birth year], 48 y.o.   MRN: [id number]  HPI  Here with onset mild red/tender/swelling just next to the base and side of nail, right great foot.  No fever, drainage, red streaks. Pt denies chest pain, increased sob or doe, wheezing, orthopnea, PND, increased LE swelling, palpitations, dizziness or syncope. No hx of MRSA or FH of same.  No prior hx.  Going out of town very soon for holiday weekend with family Past Medical History  Diagnosis Date  . DEPRESSION   . OSTEOPENIA   . TINEA CORPORIS   . Menopausal syndrome   . Kidney stone 07/2009   Past Surgical History  Procedure Laterality Date  . No past surgeries      reports that she has never smoked. She does not have any smokeless tobacco history on file. She reports that she does not drink alcohol or use illicit drugs. family history includes Cancer in her father. Not on File Current Outpatient Prescriptions on File Prior to Visit  Medication Sig Dispense Refill  . betamethasone dipropionate (DIPROLENE) 0.05 % cream Apply topically 2 (two) times daily.      . cetirizine (ZYRTEC) 10 MG tablet Take 1 tablet (10 mg total) by mouth daily.  30 tablet  11  . ibuprofen (ADVIL) 200 MG tablet Take 1-2 tablets (200-400 mg total) by mouth every 6 (six) hours as needed for pain.  30 tablet  0  . pseudoephedrine (SUDAFED) 30 MG tablet Take 1-2 tablets (30-60 mg total) by mouth every 4 (four) hours as needed for congestion.  30 tablet  0   No current facility-administered medications on file prior to visit.   Review of Systems All otherwise neg per pt     Objective:   Physical Exam BP 110/82  Pulse 63  Temp(Src) 98.4 F (36.9 C) (Oral)  Ht 5\' 8"  (1.727 m)  Wt 150 lb 2 oz (68.096 kg)  BMI 22.83 kg/m2  SpO2 95% VS noted,  Constitutional: Pt appears well-developed, well-nourished.  HENT: Head: NCAT.  Right Ear: External ear normal.  Left Ear: External ear normal.  Eyes: .  Pupils are equal, round, and reactive to light. Conjunctivae and EOM are normal Neck: Normal range of motion. Neck supple.  Cardiovascular: Normal rate and regular rhythm.   Pulmonary/Chest: Effort normal and breath sounds normal.  Neurological: Pt is alert. Not confused , motor grossly intact Skin: right great toe with mild but signficant red/tender/swelling adjacent to base and lateral side right great toenail Psychiatric: Pt behavior is normal. No agitation.     Assessment & Plan:

## 2013-11-24 NOTE — Assessment & Plan Note (Signed)
Mild to mod, for antibx course,  to f/u any worsening symptoms or concerns 

## 2013-12-23 ENCOUNTER — Emergency Department (HOSPITAL_COMMUNITY)
Admission: EM | Admit: 2013-12-23 | Discharge: 2013-12-23 | Disposition: A | Discharge status: Home / Self Care | Payer: PRIVATE HEALTH INSURANCE | Attending: Emergency Medicine | Admitting: Emergency Medicine

## 2013-12-23 ENCOUNTER — Encounter (HOSPITAL_COMMUNITY): Payer: Self-pay | Admitting: Emergency Medicine

## 2013-12-23 DIAGNOSIS — Z79899 Other long term (current) drug therapy: Secondary | ICD-10-CM | POA: Diagnosis not present

## 2013-12-23 DIAGNOSIS — R1032 Left lower quadrant pain: Secondary | ICD-10-CM | POA: Diagnosis not present

## 2013-12-23 DIAGNOSIS — Z8619 Personal history of other infectious and parasitic diseases: Secondary | ICD-10-CM | POA: Insufficient documentation

## 2013-12-23 DIAGNOSIS — N200 Calculus of kidney: Secondary | ICD-10-CM | POA: Diagnosis not present

## 2013-12-23 DIAGNOSIS — Z8742 Personal history of other diseases of the female genital tract: Secondary | ICD-10-CM | POA: Diagnosis not present

## 2013-12-23 DIAGNOSIS — N23 Unspecified renal colic: Secondary | ICD-10-CM

## 2013-12-23 DIAGNOSIS — R1012 Left upper quadrant pain: Secondary | ICD-10-CM | POA: Diagnosis present

## 2013-12-23 DIAGNOSIS — Z8739 Personal history of other diseases of the musculoskeletal system and connective tissue: Secondary | ICD-10-CM | POA: Diagnosis not present

## 2013-12-23 DIAGNOSIS — Z8659 Personal history of other mental and behavioral disorders: Secondary | ICD-10-CM | POA: Insufficient documentation

## 2013-12-23 DIAGNOSIS — R319 Hematuria, unspecified: Secondary | ICD-10-CM

## 2013-12-23 LAB — URINALYSIS, ROUTINE W REFLEX MICROSCOPIC
Bilirubin Urine: NEGATIVE
GLUCOSE, UA: NEGATIVE mg/dL
KETONES UR: NEGATIVE mg/dL
NITRITE: NEGATIVE
PROTEIN: NEGATIVE mg/dL
Specific Gravity, Urine: 1.022 (ref 1.005–1.030)
UROBILINOGEN UA: 0.2 mg/dL (ref 0.0–1.0)
pH: 6 (ref 5.0–8.0)

## 2013-12-23 LAB — CBC WITH DIFFERENTIAL/PLATELET
Basophils Absolute: 0 10*3/uL (ref 0.0–0.1)
Basophils Relative: 1 % (ref 0–1)
EOS ABS: 0 10*3/uL (ref 0.0–0.7)
EOS PCT: 1 % (ref 0–5)
HEMATOCRIT: 38.4 % (ref 36.0–46.0)
Hemoglobin: 13.4 g/dL (ref 12.0–15.0)
LYMPHS ABS: 1.3 10*3/uL (ref 0.7–4.0)
LYMPHS PCT: 26 % (ref 12–46)
MCH: 30.9 pg (ref 26.0–34.0)
MCHC: 34.9 g/dL (ref 30.0–36.0)
MCV: 88.5 fL (ref 78.0–100.0)
MONO ABS: 0.3 10*3/uL (ref 0.1–1.0)
Monocytes Relative: 7 % (ref 3–12)
Neutro Abs: 3.3 10*3/uL (ref 1.7–7.7)
Neutrophils Relative %: 65 % (ref 43–77)
PLATELETS: 187 10*3/uL (ref 150–400)
RBC: 4.34 MIL/uL (ref 3.87–5.11)
RDW: 12.2 % (ref 11.5–15.5)
WBC: 5 10*3/uL (ref 4.0–10.5)

## 2013-12-23 LAB — COMPREHENSIVE METABOLIC PANEL
ALT: 14 U/L (ref 0–35)
ANION GAP: 12 (ref 5–15)
AST: 18 U/L (ref 0–37)
Albumin: 4 g/dL (ref 3.5–5.2)
Alkaline Phosphatase: 67 U/L (ref 39–117)
BUN: 22 mg/dL (ref 6–23)
CALCIUM: 9.1 mg/dL (ref 8.4–10.5)
CO2: 25 meq/L (ref 19–32)
CREATININE: 0.72 mg/dL (ref 0.50–1.10)
Chloride: 106 mEq/L (ref 96–112)
GLUCOSE: 116 mg/dL — AB (ref 70–99)
Potassium: 3.8 mEq/L (ref 3.7–5.3)
Sodium: 143 mEq/L (ref 137–147)
TOTAL PROTEIN: 6.7 g/dL (ref 6.0–8.3)
Total Bilirubin: 0.7 mg/dL (ref 0.3–1.2)

## 2013-12-23 LAB — LIPASE, BLOOD: LIPASE: 42 U/L (ref 11–59)

## 2013-12-23 LAB — URINE MICROSCOPIC-ADD ON

## 2013-12-23 MED ORDER — HYDROCODONE-ACETAMINOPHEN 5-325 MG PO TABS: 1.0000 | ORAL_TABLET | Freq: Four times a day (QID) | ORAL | Status: DC | PRN

## 2013-12-23 MED ORDER — HYDROCODONE-ACETAMINOPHEN 5-325 MG PO TABS
1.0000 | ORAL_TABLET | Freq: Once | ORAL | Status: AC
  Administered 2013-12-23: 1 via ORAL
  Filled 2013-12-23: qty 1

## 2013-12-23 MED ORDER — ONDANSETRON HCL 4 MG PO TABS: 4.0000 mg | ORAL_TABLET | Freq: Four times a day (QID) | ORAL | Status: DC

## 2013-12-23 MED ORDER — FENTANYL CITRATE 0.05 MG/ML IJ SOLN
100.0000 ug | Freq: Once | INTRAMUSCULAR | Status: AC
  Administered 2013-12-23: 100 ug via INTRAVENOUS
  Filled 2013-12-23: qty 2

## 2013-12-23 MED ORDER — SODIUM CHLORIDE 0.9 % IV BOLUS (SEPSIS)
1000.0000 mL | Freq: Once | INTRAVENOUS | Status: AC
  Administered 2013-12-23: 1000 mL via INTRAVENOUS

## 2013-12-23 MED ORDER — ONDANSETRON HCL 4 MG/2ML IJ SOLN
4.0000 mg | Freq: Once | INTRAMUSCULAR | Status: AC
  Administered 2013-12-23: 4 mg via INTRAVENOUS
  Filled 2013-12-23: qty 2

## 2013-12-23 MED ORDER — TRAMADOL HCL 50 MG PO TABS: 50.0000 mg | ORAL_TABLET | Freq: Four times a day (QID) | ORAL | Status: DC | PRN

## 2013-12-23 MED ORDER — TAMSULOSIN HCL 0.4 MG PO CAPS: 0.4000 mg | ORAL_CAPSULE | Freq: Every day | ORAL | Status: DC

## 2013-12-23 NOTE — ED Provider Notes (Signed)
CSN: [id number]     Arrival date & time 12/23/13  1914 History   First MD Initiated Contact with Patient 12/23/13 [phone]     Chief Complaint  Patient presents with  . Abdominal Pain   Patient is a 48 y.o. female presenting with abdominal pain.  Abdominal Pain Associated symptoms: nausea   Associated symptoms: no chills, no constipation, no diarrhea, no dysuria, no fatigue, no fever, no hematuria and no vomiting     Patient is a 48 y.o. Female with PMH of kidney stones presents to the ED with LUQ pain and urinary urgency and frequency.  Pateint states that she woke up this morning and had to urinate this morning and after urinating felt that she had not completely emptied her bladder.  She has had no pain with urination.  She states that she has a constant aching pain in her LUQ which started at approximately 6 am this morning and has not gotten any better.  Pain is worsened with walking and movement and is relieved by laying down.  Patient states that she has tried no medications at home at this time.  She denies any blood in her urine.  She states that her last kidney stone felt very similar to this.  Her last stone was in 2012.  She is followed by Dr. Gaynelle Arabian with [facility].  Chart review reveals left sided 5 mm stone in the upper pole of the left kidney in 2012.  Past Medical History  Diagnosis Date  . DEPRESSION   . OSTEOPENIA   . TINEA CORPORIS   . Menopausal syndrome   . Kidney stone 07/2009   Past Surgical History  Procedure Laterality Date  . No past surgeries     Family History  Problem Relation Age of Onset  . Cancer Father    History  Substance Use Topics  . Smoking status: Never Smoker   . Smokeless tobacco: Not on file     Comment: Married. Husband is COO [employer]. student at [employer] - grad planned 07/2011  . Alcohol Use: No   OB History   Grav Para Term Preterm Abortions TAB SAB Ect Mult Living                 Review of Systems   Constitutional: Negative for fever, chills and fatigue.  Gastrointestinal: Positive for nausea and abdominal pain. Negative for vomiting, diarrhea, constipation and blood in stool.  Genitourinary: Positive for urgency and frequency. Negative for dysuria, hematuria, decreased urine volume and difficulty urinating.  All other systems reviewed and are negative.     Allergies  Review of patient's allergies indicates no known allergies.  Home Medications   Prior to Admission medications   Medication Sig Start Date End Date Taking? Authorizing Provider  HYDROcodone-acetaminophen (NORCO/VICODIN) 5-325 MG per tablet Take 1 tablet by mouth every 6 (six) hours as needed for moderate pain or severe pain. 12/23/13   Everette Dimauro A Forcucci, PA-C  ondansetron (ZOFRAN) 4 MG tablet Take 1 tablet (4 mg total) by mouth every 6 (six) hours. 12/23/13   Julliana Whitmyer A Forcucci, PA-C  tamsulosin (FLOMAX) 0.4 MG CAPS capsule Take 1 capsule (0.4 mg total) by mouth daily. 12/23/13   Christepher Melchior A Forcucci, PA-C  traMADol (ULTRAM) 50 MG tablet Take 1 tablet (50 mg total) by mouth every 6 (six) hours as needed. 12/23/13   Gaylen Venning A Forcucci, PA-C   BP 99/65  Pulse 52  Temp(Src) 97.7 F (36.5 C) (Oral)  Resp  17  Ht 5\' 8"  (1.727 m)  Wt 148 lb (67.132 kg)  BMI 22.51 kg/m2  SpO2 100% Physical Exam  Nursing note and vitals reviewed. Constitutional: She is oriented to person, place, and time. She appears well-developed and well-nourished. No distress.  HENT:  Head: Normocephalic and atraumatic.  Mouth/Throat: Oropharynx is clear and moist. No oropharyngeal exudate.  Eyes: Conjunctivae and EOM are normal. Pupils are equal, round, and reactive to light. No scleral icterus.  Neck: Normal range of motion. Neck supple. No JVD present. No thyromegaly present.  Cardiovascular: Normal rate, regular rhythm, normal heart sounds and intact distal pulses.  Exam reveals no gallop and no friction rub.   No murmur  heard. Pulmonary/Chest: Effort normal and breath sounds normal. No respiratory distress. She has no wheezes. She has no rales. She exhibits no tenderness.  Abdominal: Soft. Normal appearance and bowel sounds are normal. There is tenderness in the left upper quadrant and left lower quadrant. There is CVA tenderness (left sided). There is no rigidity, no rebound, no guarding, no tenderness at McBurney's point and negative Murphy's sign.  Musculoskeletal: Normal range of motion.  Lymphadenopathy:    She has no cervical adenopathy.  Neurological: She is alert and oriented to person, place, and time. She has normal strength. No cranial nerve deficit or sensory deficit. Coordination normal.  Skin: Skin is warm and dry. She is not diaphoretic.  Psychiatric: She has a normal mood and affect. Her behavior is normal. Judgment and thought content normal.    ED Course  Procedures (including critical care time) Labs Review Labs Reviewed  COMPREHENSIVE METABOLIC PANEL - Abnormal; Notable for the following:    Glucose, Bld 116 (*)    All other components within normal limits  URINALYSIS, ROUTINE W REFLEX MICROSCOPIC - Abnormal; Notable for the following:    APPearance CLOUDY (*)    Hgb urine dipstick LARGE (*)    Leukocytes, UA SMALL (*)    All other components within normal limits  CBC WITH DIFFERENTIAL  LIPASE, BLOOD  URINE MICROSCOPIC-ADD ON    Imaging Review No results found.   EKG Interpretation None      MDM   Final diagnoses:  Hematuria  Renal colic on left side   Patient is a 48 y.o. Female who presents to the ED with LUQ pain, urgency, and frequency.  Patient is afebrile and vital signs are stable.  Physical exam reveals left CVA tenderness, and LUQ tenderness.  CBC, CMP, and Lipase are unremarkable.  UA shows hematuria with negative nitrites and negative leukocytes.  Patient was treated here with NS bolus and 100 mcg of fentanyl with good relief of symptoms.  I have discussed  this patient with Dr. Eulis Foster who agrees with me that given history of stones and previous imaging that no imaging is required today.  Will discharge the patient home with tramadol and hydrocodone to take as needed for pain and will send the patient home with flomax.  Patient is to follow-up with Dr. Gaynelle Arabian in 1 week.  She was told to return to the ED with fever, intractable pain, and difficulty with urinating.  Patient was instructed to strain her urine for a stone.  Patient was seen by and discussed with Dr. Eulis Foster who agrees with the above plan and workup.  Patient states understanding and agreement at this time.      Cherylann Parr, PA-C 12/23/13 1119

## 2013-12-23 NOTE — ED Notes (Signed)
The pt woke up this am with abd pain and feeling like she was not able to  Empty her bladder.  She did void but her bladder feels  Irritated and does not feel   Any better  lmp none

## 2013-12-23 NOTE — Discharge Instructions (Signed)

## 2013-12-23 NOTE — ED Provider Notes (Signed)
  Face-to-face evaluation   History: Left upper quadrant abdominal pain, started. This morning, associated with sensation of urgency for urination. No dysuria, fever, or more visible hematuria. Similar pain in the past, with passage of a kidney stone.  Physical exam: Alert, cooperative. Comfortable after treatment in the emergency department.  Discussed with patient, imaging, versus empiric treatment. Risks and benefits discussed. She agrees to proceed without imaging at this time.  Assessment: Likely ureteral colic, probable partially obstructed ureteral stone. She is nontoxic, without evidence for serious bacterial infection, metabolic instability, or impending vascular collapse.  Plan: Symptomatic treatment. Urology followup in 5-7 days  Medical screening examination/treatment/procedure(s) were conducted as a shared visit with non-physician practitioner(s) and myself.  I personally evaluated the patient during the encounter  Richarda Blade, MD 12/23/13 [phone]

## 2014-03-18 ENCOUNTER — Encounter: Payer: Self-pay | Admitting: Internal Medicine

## 2014-03-18 ENCOUNTER — Ambulatory Visit (INDEPENDENT_AMBULATORY_CARE_PROVIDER_SITE_OTHER): Payer: PRIVATE HEALTH INSURANCE | Admitting: Internal Medicine

## 2014-03-18 VITALS — BP 128/80 | HR 78 | Temp 99.1°F | Wt 154.0 lb

## 2014-03-18 DIAGNOSIS — H6642 Suppurative otitis media, unspecified, left ear: Secondary | ICD-10-CM

## 2014-03-18 DIAGNOSIS — J069 Acute upper respiratory infection, unspecified: Secondary | ICD-10-CM | POA: Insufficient documentation

## 2014-03-18 DIAGNOSIS — H6692 Otitis media, unspecified, left ear: Secondary | ICD-10-CM | POA: Insufficient documentation

## 2014-03-18 MED ORDER — PSEUDOEPHEDRINE HCL ER 120 MG PO TB12: 120.0000 mg | ORAL_TABLET | Freq: Two times a day (BID) | ORAL | Status: DC | PRN

## 2014-03-18 MED ORDER — PROMETHAZINE-CODEINE 6.25-10 MG/5ML PO SYRP: 5.0000 mL | ORAL_SOLUTION | ORAL | Status: DC | PRN

## 2014-03-18 MED ORDER — FLUTICASONE PROPIONATE 50 MCG/ACT NA SUSP: 2.0000 | Freq: Every day | NASAL | Status: DC

## 2014-03-18 MED ORDER — AMOXICILLIN-POT CLAVULANATE 875-125 MG PO TABS: 1.0000 | ORAL_TABLET | Freq: Two times a day (BID) | ORAL | Status: DC

## 2014-03-18 NOTE — Progress Notes (Signed)
Pre visit review using our clinic review tool, if applicable. No additional management support is needed unless otherwise documented below in the visit note. 

## 2014-03-18 NOTE — Progress Notes (Signed)
   Subjective:    Patient ID: Rebekah Larson, female    DOB: 02/26/1966, 48 y.o.   MRN: [id number]  Sinusitis This is a new problem. The current episode started in the past 7 days. The pain is moderate. Associated symptoms include chills, congestion, coughing, shortness of breath, sinus pressure and a sore throat.   Came back from [place] last night   Review of Systems  Constitutional: Positive for chills.  HENT: Positive for congestion, sinus pressure and sore throat.   Respiratory: Positive for cough and shortness of breath.        Objective:   Physical Exam  Constitutional: She appears well-nourished.  HENT:  Right Ear: External ear normal.  Left Ear: External ear normal.  Nose: Nose normal.  Mouth/Throat: No oropharyngeal exudate.  eryth throat  Eyes: Left eye exhibits no discharge.  Cardiovascular: Normal rate.   No murmur heard. Pulmonary/Chest: She has no wheezes. She has no rales.  Abdominal: There is no tenderness.  Musculoskeletal: She exhibits no tenderness.  Skin: She is not diaphoretic.    R TM is red      Assessment & Plan:

## 2014-03-18 NOTE — Progress Notes (Signed)
Patient ID: Rebekah Larson, female   DOB: 19-Apr-1965, 48 y.o.   MRN: [id number]

## 2014-03-18 NOTE — Assessment & Plan Note (Addendum)
Prom - cod syr Augmentin x 10 d Probiotic

## 2014-03-22 DIAGNOSIS — Z923 Personal history of irradiation: Secondary | ICD-10-CM

## 2014-03-22 DIAGNOSIS — C50919 Malignant neoplasm of unspecified site of unspecified female breast: Secondary | ICD-10-CM

## 2014-03-22 HISTORY — DX: Personal history of irradiation: Z92.3

## 2014-03-22 HISTORY — DX: Malignant neoplasm of unspecified site of unspecified female breast: C50.919

## 2014-03-22 HISTORY — PX: BREAST LUMPECTOMY: SHX2

## 2014-04-09 ENCOUNTER — Other Ambulatory Visit: Payer: Self-pay | Admitting: Obstetrics and Gynecology

## 2014-04-09 DIAGNOSIS — R928 Other abnormal and inconclusive findings on diagnostic imaging of breast: Secondary | ICD-10-CM

## 2014-04-16 ENCOUNTER — Ambulatory Visit
Admission: RE | Admit: 2014-04-16 | Discharge: 2014-04-16 | Disposition: A | Discharge status: Home / Self Care | Payer: PRIVATE HEALTH INSURANCE | Source: Ambulatory Visit | Attending: Obstetrics and Gynecology | Admitting: Obstetrics and Gynecology

## 2014-04-16 DIAGNOSIS — R928 Other abnormal and inconclusive findings on diagnostic imaging of breast: Secondary | ICD-10-CM

## 2014-04-17 ENCOUNTER — Other Ambulatory Visit: Payer: Self-pay | Admitting: Obstetrics and Gynecology

## 2014-04-17 DIAGNOSIS — R921 Mammographic calcification found on diagnostic imaging of breast: Secondary | ICD-10-CM

## 2014-04-27 ENCOUNTER — Ambulatory Visit
Admission: RE | Admit: 2014-04-27 | Discharge: 2014-04-27 | Disposition: A | Discharge status: Home / Self Care | Payer: PRIVATE HEALTH INSURANCE | Source: Ambulatory Visit | Attending: Obstetrics and Gynecology | Admitting: Obstetrics and Gynecology

## 2014-04-27 DIAGNOSIS — R921 Mammographic calcification found on diagnostic imaging of breast: Secondary | ICD-10-CM

## 2014-04-27 MED ORDER — GADOBENATE DIMEGLUMINE 529 MG/ML IV SOLN
14.0000 mL | Freq: Once | INTRAVENOUS | Status: AC | PRN
  Administered 2014-04-27: 14 mL via INTRAVENOUS

## 2014-04-29 ENCOUNTER — Other Ambulatory Visit: Payer: Self-pay | Admitting: Obstetrics and Gynecology

## 2014-04-29 DIAGNOSIS — R921 Mammographic calcification found on diagnostic imaging of breast: Secondary | ICD-10-CM

## 2014-05-06 ENCOUNTER — Inpatient Hospital Stay: Admission: RE | Admit: 2014-05-06 | Payer: PRIVATE HEALTH INSURANCE | Source: Ambulatory Visit

## 2014-05-17 ENCOUNTER — Ambulatory Visit
Admission: RE | Admit: 2014-05-17 | Discharge: 2014-05-17 | Disposition: A | Discharge status: Home / Self Care | Payer: PRIVATE HEALTH INSURANCE | Source: Ambulatory Visit | Attending: Obstetrics and Gynecology | Admitting: Obstetrics and Gynecology

## 2014-05-17 ENCOUNTER — Other Ambulatory Visit: Payer: Self-pay | Admitting: Obstetrics and Gynecology

## 2014-05-17 DIAGNOSIS — R921 Mammographic calcification found on diagnostic imaging of breast: Secondary | ICD-10-CM

## 2014-05-17 DIAGNOSIS — C50911 Malignant neoplasm of unspecified site of right female breast: Secondary | ICD-10-CM

## 2014-05-17 HISTORY — DX: Malignant neoplasm of unspecified site of right female breast: C50.911

## 2014-05-20 ENCOUNTER — Ambulatory Visit
Admission: RE | Admit: 2014-05-20 | Discharge: 2014-05-20 | Disposition: A | Discharge status: Home / Self Care | Payer: PRIVATE HEALTH INSURANCE | Source: Ambulatory Visit | Attending: Obstetrics and Gynecology | Admitting: Obstetrics and Gynecology

## 2014-05-20 DIAGNOSIS — R921 Mammographic calcification found on diagnostic imaging of breast: Secondary | ICD-10-CM

## 2014-05-21 ENCOUNTER — Other Ambulatory Visit: Payer: Self-pay | Admitting: Obstetrics and Gynecology

## 2014-05-21 DIAGNOSIS — D0511 Intraductal carcinoma in situ of right breast: Secondary | ICD-10-CM

## 2014-05-21 HISTORY — DX: Intraductal carcinoma in situ of right breast: D05.11

## 2014-05-24 ENCOUNTER — Other Ambulatory Visit: Payer: Self-pay

## 2014-05-24 ENCOUNTER — Encounter: Payer: Self-pay | Admitting: Radiation Oncology

## 2014-05-24 ENCOUNTER — Telehealth: Payer: Self-pay | Admitting: *Deleted

## 2014-05-24 NOTE — Telephone Encounter (Signed)
Scheduled and confirmed new pt appt for 05/27/14 with Dr. Lindi Adie and genetic counselor. Gave directions and instructions. Pt denies needs at this time. Encourage pt to call with questions or concerns. Contact information given.

## 2014-05-24 NOTE — Progress Notes (Signed)
[facility] NEW PATIENT EVALUATION  Name: Rebekah Larson MRN: [id number]  Date:   05/24/2014           DOB: 05-22-1965  Status: outpatient   CC: Dr. Rolm Bookbinder, Dr. Nicholas Lose   REFERRING PHYSICIAN: Dr. 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 female who is seen today through the courtesy of Dr. Rolm Bookbinder for discussion of possible radiation therapy in the management of her DCIS of the right breast.  At the time of a screening mammogram on 04/04/2014 she was noted to have right breast calcifications.  Additional views on 04/16/2014 showed an area of calcifications over 1.0 x 0.5 x 1.2 cm within the far posterior upper inner quadrant of the right breast.  Breast MR on 04/27/2014 was totally negative.  She underwent stereotactic biopsy on 05/17/2014 and this was diagnostic for DCIS with calcifications felt to be intermediate to high-grade.  ER positive at 98%, PR positive at 2%.  She was seen in consultation by Dr. Rolm Bookbinder to discussed local management options.  In view of her family history, she is being seen by genetic counseling.  PREVIOUS RADIATION THERAPY: No   PAST MEDICAL HISTORY:  has a past medical history of DEPRESSION; OSTEOPENIA; TINEA CORPORIS; Menopausal syndrome; and Kidney stone (07/2009).     PAST SURGICAL HISTORY:  Past Surgical History  Procedure Laterality Date  . No past surgeries       FAMILY HISTORY: family history includes Cancer in her father.  Her father died in age 53 with a history of colon cancer.  Her mother is alive and well at age 60.  She underwent mastectomy for DCIS in her 71s.  She also has a few second-degree relatives with a history of breast cancer, uncertain age.   SOCIAL HISTORY:  reports that she has never smoked. She does not have any smokeless tobacco history on  file. She reports that she does not drink alcohol or use illicit drugs.  Married, 2 teenage sons.  She works as a Engineer, water.   ALLERGIES: Review of patient's allergies indicates no known allergies.   MEDICATIONS:  Current Outpatient Prescriptions  Medication Sig Dispense Refill  . amoxicillin-clavulanate (AUGMENTIN) 875-125 MG per tablet Take 1 tablet by mouth 2 (two) times daily. 20 tablet 0  . fluticasone (FLONASE) 50 MCG/ACT nasal spray Place 2 sprays into both nostrils daily. 16 g 1  . promethazine-codeine (PHENERGAN WITH CODEINE) 6.25-10 MG/5ML syrup Take 5 mLs by mouth every 4 (four) hours as needed. 300 mL 0  . pseudoephedrine (SUDAFED) 120 MG 12 hr tablet Take 1 tablet (120 mg total) by mouth 2 (two) times daily as needed for congestion. 60 tablet 2   No current facility-administered medications for this visit.     REVIEW OF SYSTEMS:  Pertinent items are noted in HPI.    PHYSICAL EXAM: Not examined today.    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  We discussed the natural history and biology of DCIS.  We discussed treatment options which include mastectomy versus breast conserving surgery with or without radiation therapy.  If she chooses breast conserving surgery then I do recommend radiation therapy since she is not  "low risk" based on her nuclear grade.  We discussed the benefits of radiation therapy in terms of local control.  We discussed local recurrence and cure rates with mastectomy, breast  conserving surgery with and without radiation therapy.  We discussed the potential acute and late toxicities of radiation therapy.  We discussed the planning and radiation schedule/fractionation along with the need for a preradiation therapy mammogram to confirm removal of all suspicious microcalcifications.  I favor standard fractionation in view of her age, and intermediate to high nuclear grade.  She will see Dr. Lindi Adie to discussed posttreatment antiestrogen therapy.  After she undergoes genetic evaluation she will proceed with definitive surgery.  I can see her back for a follow-up visit after surgery.   PLAN: As discussed above.  She was given literature for review from "Up To Date" and NCCN Guidelines.   I spent 45 minutes face to face with the patient and more than 50% of that time was spent in counseling and/or coordination of care.

## 2014-05-27 ENCOUNTER — Ambulatory Visit (HOSPITAL_BASED_OUTPATIENT_CLINIC_OR_DEPARTMENT_OTHER): Payer: PRIVATE HEALTH INSURANCE | Admitting: Hematology and Oncology

## 2014-05-27 ENCOUNTER — Ambulatory Visit (HOSPITAL_BASED_OUTPATIENT_CLINIC_OR_DEPARTMENT_OTHER): Payer: PRIVATE HEALTH INSURANCE | Admitting: Genetic Counselor

## 2014-05-27 ENCOUNTER — Encounter: Payer: Self-pay | Admitting: Hematology and Oncology

## 2014-05-27 ENCOUNTER — Other Ambulatory Visit: Payer: PRIVATE HEALTH INSURANCE

## 2014-05-27 ENCOUNTER — Ambulatory Visit: Payer: PRIVATE HEALTH INSURANCE

## 2014-05-27 ENCOUNTER — Encounter: Payer: Self-pay | Admitting: Genetic Counselor

## 2014-05-27 VITALS — BP 137/87 | HR 72 | Temp 98.0°F | Resp 20 | Ht 68.0 in | Wt 152.3 lb

## 2014-05-27 DIAGNOSIS — D0511 Intraductal carcinoma in situ of right breast: Secondary | ICD-10-CM | POA: Insufficient documentation

## 2014-05-27 DIAGNOSIS — Z800 Family history of malignant neoplasm of digestive organs: Secondary | ICD-10-CM

## 2014-05-27 DIAGNOSIS — Z170 Estrogen receptor positive status [ER+]: Secondary | ICD-10-CM

## 2014-05-27 DIAGNOSIS — Z803 Family history of malignant neoplasm of breast: Secondary | ICD-10-CM

## 2014-05-27 NOTE — Assessment & Plan Note (Signed)
Right breast DCIS intermediate to high-grade ER 90%, PR 2% diagnosed by mammogram showing 10 x 5 x 12 mm loose calcifications  Radiology pathology review: I discussed with her extensively the results of pathology and radiology reports and the difference between DCIS and invasive breast cancer. I discussed that she has a stage 0 breast cancer and PCAs is considered to be the precancerous to invasive disease.  Prognosis discussions:  I discussed the significance of grade and comedonecrosis is being prognostic for DCIS. Based on [facility]  DCIS nomogram, her 5 year risk of recurrence is at 2% and 10 year risk of recurrence is at 4% if she does surgery followed by radiation and antiestrogen therapy. Patient understands that either radiation or antiestrogen therapy do not prolong survival. Date help with decreasing the risk of recurrence. If no therapy is done after surgery , risk of recurrence is 12% 5 years and 19% 10 years   Treatment plan: lumpectomy followed by radiation followed by antiestrogen therapy  Antiestrogen therapy counseling: 1.  I discussed the 2 options for antiestrogen adjuvant treatment which include both tamoxifen and anastrozole. I discussed the difference between the 2 drugs and the risks and benefits of each of these options. Based on the risk profile and her age, I recommended anastrozole 1 mg daily for 5 years. Patient had gone through menopause 4-5 years ago.  2.  Anastrozole counseling:We discussed the risks and benefits of anti-estrogen therapy with aromatase inhibitors. These include but not limited to insomnia, hot flashes, mood changes, vaginal dryness, bone density loss, and weight gain. Although rare, serious side effects including endometrial cancer, risk of blood clots were also discussed. We strongly believe that the benefits far outweigh the risks. Patient understands these risks and consented to starting treatment. Planned treatment duration is 5 years.   return to clinic  after surgery to discuss subsequent treatment plan.

## 2014-05-27 NOTE — Progress Notes (Signed)
REFERRING PROVIDER: Rowe Clack, MD [address] [place], [place] [address]   Nicholas Lose, MD  PRIMARY PROVIDER:  Gwendolyn Grant, MD  PRIMARY REASON FOR VISIT:  1. Neoplasm of right breast, primary tumor staging category Tis: ductal carcinoma in situ (DCIS)   2. Family history of colon cancer   3. Family history of breast cancer      HISTORY OF PRESENT ILLNESS:   Ms. Clendenning, a 49 y.o. female, was seen for a [facility] cancer genetics consultation at the request of Dr. Lindi Adie due to a personal and family history of cancer.  Ms. Niehaus presents to clinic today to discuss the possibility of a hereditary predisposition to cancer, genetic testing, and to further clarify her future cancer risks, as well as potential cancer risks for family members.   In February 2016, at the age of 24, Ms. Hillesheim was diagnosed with DCIS of the right breast. This willl be treated with lumpectomy, and possibly only radiation.  Surgery decisions are awaiting genetic test results.   CANCER HISTORY:    Neoplasm of right breast, primary tumor staging category Tis: ductal carcinoma in situ (DCIS)   04/16/2014 Mammogram  right breast calcifications 10 x 5 x 12 mm loose group of slightly coarse and slightly heterogeneous calcifications posterior upper right breast   05/17/2014 Initial Diagnosis  right breast DCIS with calcifications intermediate to high-grade , ER 98%, PR 2%     HORMONAL RISK FACTORS:  Menarche was at age 61.  First live birth at age 33.  OCP use for approximately 20 years.  Ovaries intact: yes.  Hysterectomy: no.  Menopausal status: postmenopausal.  HRT use: 0 years. Colonoscopy: no; not examined. Mammogram within the last year: yes. Number of breast biopsies: 1. Up to date with pelvic exams:  yes. Any excessive radiation exposure in the past:  no  Past Medical History  Diagnosis Date  . OSTEOPENIA   . TINEA CORPORIS   . Menopausal syndrome   .  Kidney stone 07/2009  . DEPRESSION     Resolved - not a problem for last 5 years  . Neoplasm of right breast, primary tumor staging category Tis: ductal carcinoma in situ (DCIS) 05/27/2014    Past Surgical History  Procedure Laterality Date  . No past surgeries      History   Social History  . Marital Status: Married    Spouse Name: Coralyn Mark  . Number of Children: 2  . Years of Education: N/A   Social History Main Topics  . Smoking status: Never Smoker   . Smokeless tobacco: Never Used     Comment: Married. Husband is COO [employer]. student at [employer] - grad planned 07/2011  . Alcohol Use: 1.2 oz/week    2 Glasses of wine per week  . Drug Use: No  . Sexual Activity: Yes    Birth Control/ Protection: Post-menopausal   Other Topics Concern  . None   Social History Narrative     FAMILY HISTORY:  We obtained a detailed, 4-generation family history.  Significant diagnoses are listed below: Family History  Problem Relation Age of Onset  . Colon cancer Father 19  . Breast cancer Mother 33    DCIS  . Cancer Paternal Grandfather 50    spinal tumor  . Breast cancer Other     MGM's sister dx over 61  . Breast cancer Other     MGF's sister dx over 27  Ms. Riel had two sisters, one died at birth from Trisomy 2.  Her mother was diagnosed with DCIS at age 50.  Ms. Kugelman mother had a maternal half brother who died of pulmonary edema.  Her grandmother had a sister with breast cancer and her maternal grandfather had a sister with breast cancer.  Ms. Balon father died of colon cancer.  He had one sister and one brother who did not have cancer.  Her paternal grandfather died of a spinal tumor. Patient's maternal ancestors are of [place] descent, and paternal ancestors are of [place] descent. There is no reported Ashkenazi Jewish ancestry. There is no known consanguinity.  GENETIC COUNSELING ASSESSMENT: Elfida Shimada is a 49 y.o. female with a personal and  family history of breast cancer which somewhat suggestive of a hereditary cancer syndrome and predisposition to cancer. We, therefore, discussed and recommended the following at today's visit.   DISCUSSION: We reviewed the characteristics, features and inheritance patterns of hereditary cancer syndromes. We primarily discussed BRCA mtuations, but reviewed CHEK2 mutations based on the family history of breast and colon cancer.  We also discussed genetic testing, including the appropriate family members to test, the process of testing, insurance coverage and turn-around-time for results. We discussed the implications of a negative, positive and/or variant of uncertain significant result. In order to get genetic test results in a timely manner so that Ms. Dittmar can use these genetic test results for surgical decisions, we recommended Ms. Buczek pursue genetic testing for the BRCAPlus. If this test is negative, we then recommend Ms. Shima pursue reflex genetic testing to the CancerNExt gene panel. The BRCAPlus gene panel offered by [facility] and includes sequencing and rearrangement analysis for the following 6 genes: BRCA1, BRCA2, CDH1, PALB2, PTEN, and TP53.  The CancerNext gene panel offered by [facility] includes sequencing and rearrangement analysis for the following 32 genes:   APC, ATM, BARD1, BMPR1A, BRCA1, BRCA2, BRIP1, CDH1, CDK4, CDKN2A, CHEK2, EPCAM, GREM1, MLH1, MRE11A, MSH2, MSH6, MUTYH, NBN, NF1, PALB2, PMS2, POLD1, POLE, PTEN, RAD50, RAD51D, SMAD4, SMARCA4, STK11, and TP53.    PLAN: After considering the risks, benefits, and limitations,Ms. Tsosie  provided informed consent to pursue genetic testing and the blood sample was sent to [facility] for analysis of the [place], reflexing to [place]. Results should be available within approximately 2 weeks' time, at which point they will be disclosed by telephone to Ms. Grieder, as will any additional recommendations warranted by  these results.  If the test is reflexed to [place], the results would be back in an additional 3-4 weeks time. Ms. Engebretson will receive a summary of her genetic counseling visit and a copy of her results once available. This information will also be available in Epic. We encouraged Ms. Deady to remain in contact with cancer genetics annually so that we can continuously update the family history and inform her of any changes in cancer genetics and testing that may be of benefit for her family. Ms. Tonkinson questions were answered to her satisfaction today. Our contact information was provided should additional questions or concerns arise.  Lastly, we encouraged Ms. Doverspike to remain in contact with cancer genetics annually so that we can continuously update the family history and inform her of any changes in cancer genetics and testing that may be of benefit for this family.   Ms.  Whicker questions were answered to her satisfaction today. Our contact information was provided should additional questions or concerns arise. Thank you for the  referral and allowing [place] to share in the care of your patient.   Karen P. Florene Glen, [place], [facility] Certified Genetic Counselor [email or web address] phone: [phone]  The patient was seen for a total of 60 minutes in face-to-face genetic counseling.  This patient was discussed with Drs. Magrinat, Lindi Adie and/or Burr Medico who agrees with the above.    _______________________________________________________________________ For Office Staff:  Number of people involved in session: 2 Was an Intern/ student involved with case: no

## 2014-05-27 NOTE — Progress Notes (Signed)
Checked in new pt with no financial concerns at this time.  Pt has my card for any billing questions or concerns. °

## 2014-05-27 NOTE — Progress Notes (Signed)
[facility] NOTE  Patient Care Team: Rowe Clack, MD as PCP - General  CHIEF COMPLAINTS/PURPOSE OF CONSULTATION:  Newly diagnosed  Right breast DCIS  HISTORY OF PRESENTING ILLNESS:  Rebekah Larson 49 y.o. female is here because of recent diagnosis of  Right breast DCIS. She had a routine screening mammogram that revealed calcifications which led to MRI of the breast and biopsy. The biopsy revealed that she had DCIS that was ER 98%, PR 2%. She had seen Dr. Donne Hazel and Dr. Valere Dross and she is here today to discuss adjuvant treatment plans. She has an appointment to see genetic counseling today. After she gets genetic counseling results she will doing surgery.  Patient is here accompanied by her husband to discuss treatment options. She works as a Environmental consultant at [employer].   I reviewed her records extensively and collaborated the history with the patient.  SUMMARY OF ONCOLOGIC HISTORY:   Neoplasm of right breast, primary tumor staging category Tis: ductal carcinoma in situ (DCIS)   04/16/2014 Mammogram  right breast calcifications 10 x 5 x 12 mm loose group of slightly coarse and slightly heterogeneous calcifications posterior upper right breast   05/17/2014 Initial Diagnosis  right breast DCIS with calcifications intermediate to high-grade , ER 98%, PR 2%    MEDICAL HISTORY:  Past Medical History  Diagnosis Date  . OSTEOPENIA   . TINEA CORPORIS   . Menopausal syndrome   . Kidney stone 07/2009  . DEPRESSION     Resolved - not a problem for last 5 years  . Neoplasm of right breast, primary tumor staging category Tis: ductal carcinoma in situ (DCIS) 05/27/2014    SURGICAL HISTORY: Past Surgical History  Procedure Laterality Date  . No past surgeries      SOCIAL HISTORY: History   Social History  . Marital Status: Married    Spouse Name: N/A  . Number of Children: N/A  . Years of Education: N/A   Occupational History  . Not on file.    Social History Main Topics  . Smoking status: Never Smoker   . Smokeless tobacco: Never Used     Comment: Married. Husband is COO [employer]. student at [employer] - grad planned 07/2011  . Alcohol Use: 1.2 oz/week    2 Glasses of wine per week  . Drug Use: No  . Sexual Activity: Yes    Birth Control/ Protection: Post-menopausal   Other Topics Concern  . Not on file   Social History Narrative    FAMILY HISTORY: Family History  Problem Relation Age of Onset  . Cancer Father     ALLERGIES:  has No Known Allergies.  MEDICATIONS:  No current outpatient prescriptions on file.   No current facility-administered medications for this visit.    REVIEW OF SYSTEMS:   Constitutional: Denies fevers, chills or abnormal night sweats Eyes: Denies blurriness of vision, double vision or watery eyes Ears, nose, mouth, throat, and face: Denies mucositis or sore throat Respiratory: Denies cough, dyspnea or wheezes Cardiovascular: Denies palpitation, chest discomfort or lower extremity swelling Gastrointestinal:  Denies nausea, heartburn or change in bowel habits Skin: Denies abnormal skin rashes Lymphatics: Denies new lymphadenopathy or easy bruising Neurological:Denies numbness, tingling or new weaknesses Behavioral/Psych: Mood is stable, no new changes  Breast:  Site of biopsy is tender and greenish in discoloration and a small lump is not palpable All other systems were reviewed with the patient and are negative.  PHYSICAL EXAMINATION:  ECOG PERFORMANCE STATUS: 0 - Asymptomatic  Filed Vitals:   05/27/14 1426  BP: 137/87  Pulse: 72  Temp: 98 F (36.7 C)  Resp: 20   Filed Weights   05/27/14 1426  Weight: 152 lb 4.8 oz (69.083 kg)    GENERAL:alert, no distress and comfortable SKIN: skin color, texture, turgor are normal, no rashes or significant lesions EYES: normal, conjunctiva are pink and non-injected, sclera clear OROPHARYNX:no exudate, no erythema and  lips, buccal mucosa, and tongue normal  NECK: supple, thyroid normal size, non-tender, without nodularity LYMPH:  no palpable lymphadenopathy in the cervical, axillary or inguinal LUNGS: clear to auscultation and percussion with normal breathing effort HEART: regular rate & rhythm and no murmurs and no lower extremity edema ABDOMEN:abdomen soft, non-tender and normal bowel sounds Musculoskeletal:no cyanosis of digits and no clubbing  PSYCH: alert & oriented x 3 with fluent speech NEURO: no focal motor/sensory deficits BREAST: small palpable lump at the site of the breast biopsy otherwise unremarkable breasts. No palpable axillary or supraclavicular lymphadenopathy (exam performed in the presence of a chaperone)   LABORATORY DATA:  I have reviewed the data as listed Lab Results  Component Value Date   WBC 5.0 12/23/2013   HGB 13.4 12/23/2013   HCT 38.4 12/23/2013   MCV 88.5 12/23/2013   PLT 187 12/23/2013   Lab Results  Component Value Date   NA 143 12/23/2013   K 3.8 12/23/2013   CL 106 12/23/2013   CO2 25 12/23/2013    RADIOGRAPHIC STUDIES: I have personally reviewed the radiological reports and agreed with the findings in the report.  Results are summarized as above  ASSESSMENT AND PLAN:  Neoplasm of right breast, primary tumor staging category Tis: ductal carcinoma in situ (DCIS) Right breast DCIS intermediate to high-grade ER 90%, PR 2% diagnosed by mammogram showing 10 x 5 x 12 mm loose calcifications  Radiology pathology review: I discussed with her extensively the results of pathology and radiology reports and the difference between DCIS and invasive breast cancer. I discussed that she has a stage 0 breast cancer and PCAs is considered to be the precancerous to invasive disease.  Prognosis discussions:  I discussed the significance of grade and comedonecrosis is being prognostic for DCIS. Based on [facility]  DCIS nomogram, her 5 year risk of recurrence is at 2% and 10 year  risk of recurrence is at 4% if she does surgery followed by radiation and antiestrogen therapy. Patient understands that either radiation or antiestrogen therapy do not prolong survival. Date help with decreasing the risk of recurrence. If no therapy is done after surgery , risk of recurrence is 12% 5 years and 19% 10 years  Treatment plan: Genetic counseling being done today, lumpectomy followed by radiation followed by antiestrogen therapy Antiestrogen therapy counseling: 1.  I discussed the 2 options for antiestrogen adjuvant treatment which include both tamoxifen and anastrozole. I discussed the difference between the 2 drugs and the risks and benefits of each of these options. Based on the risk profile and her age, I recommended anastrozole 1 mg daily for 5 years. Patient had gone through menopause 4-5 years ago.  2.  Anastrozole counseling:We discussed the risks and benefits of anti-estrogen therapy with aromatase inhibitors. These include but not limited to insomnia, hot flashes, mood changes, vaginal dryness, bone density loss, and weight gain. Although rare, serious side effects including endometrial cancer, risk of blood clots were also discussed. We strongly believe that the benefits far outweigh the risks. Patient  understands these risks and consented to starting treatment. Planned treatment duration is 5 years.  Return to clinic after surgery to discuss subsequent treatment plan.  All questions were answered. The patient knows to call the clinic with any problems, questions or concerns.    Rulon Eisenmenger, MD 3:30 PM

## 2014-05-28 ENCOUNTER — Ambulatory Visit: Payer: PRIVATE HEALTH INSURANCE | Admitting: Internal Medicine

## 2014-05-28 ENCOUNTER — Telehealth: Payer: Self-pay | Admitting: Internal Medicine

## 2014-05-28 NOTE — Telephone Encounter (Signed)
I have spoken with pt by phone I will see her tomorrow 3/9 She is scheduled for 945 - but only available between 11-1 Please leave the 945 apt as scheduled and I will ask pt to arrive after 11 to be seen tomorrow - Please do not add any other pts after 11a Thanks!

## 2014-05-28 NOTE — Telephone Encounter (Signed)
Patient came in today hoping to speak with Dr. Asa Lente.  She did not get vm to reschedule.  She is upset.  She just got a diagnosis of breast cancer and would like to talk with Dr. Asa Lente before then.  She is wanting to see Asa Lente before July.  Please advise.

## 2014-05-29 ENCOUNTER — Encounter: Payer: Self-pay | Admitting: Internal Medicine

## 2014-05-29 ENCOUNTER — Ambulatory Visit (INDEPENDENT_AMBULATORY_CARE_PROVIDER_SITE_OTHER): Payer: PRIVATE HEALTH INSURANCE | Admitting: Internal Medicine

## 2014-05-29 VITALS — BP 118/70 | HR 72 | Temp 98.1°F | Wt 150.0 lb

## 2014-05-29 DIAGNOSIS — R200 Anesthesia of skin: Secondary | ICD-10-CM

## 2014-05-29 DIAGNOSIS — M5412 Radiculopathy, cervical region: Secondary | ICD-10-CM

## 2014-05-29 DIAGNOSIS — R202 Paresthesia of skin: Secondary | ICD-10-CM

## 2014-05-29 NOTE — Patient Instructions (Addendum)
It was good to see you today.  We have reviewed your prior records including labs and tests today  Medications reviewed and updated, no changes recommended at this time.  we'll make referral for MRI neck. Our office will contact you regarding appointment(s) once made. Your results will be released to [place] (or called to you) after review, usually within 72hours after test completion. If any changes need to be made, you will be notified at that same time.  Will consider PT after result review  Please schedule followup in 6 months for annual exam, call sooner if problems.  Cervical Radiculopathy Cervical radiculopathy happens when a nerve in the neck is pinched or bruised by a slipped (herniated) disk or by arthritic changes in the bones of the cervical spine. This can occur due to an injury or as part of the normal aging process. Pressure on the cervical nerves can cause pain or numbness that runs from your neck all the way down into your arm and fingers. CAUSES  There are many possible causes, including:  Injury.  Muscle tightness in the neck from overuse.  Swollen, painful joints (arthritis).  Breakdown or degeneration in the bones and joints of the spine (spondylosis) due to aging.  Bone spurs that may develop near the cervical nerves. SYMPTOMS  Symptoms include pain, weakness, or numbness in the affected arm and hand. Pain can be severe or irritating. Symptoms may be worse when extending or turning the neck. DIAGNOSIS  Your caregiver will ask about your symptoms and do a physical exam. He or she may test your strength and reflexes. X-rays, CT scans, and MRI scans may be needed in cases of injury or if the symptoms do not go away after a period of time. Electromyography (EMG) or nerve conduction testing may be done to study how your nerves and muscles are working. TREATMENT  Your caregiver may recommend certain exercises to help relieve your symptoms. Cervical radiculopathy can,  and often does, get better with time and treatment. If your problems continue, treatment options may include:  Wearing a soft collar for short periods of time.  Physical therapy to strengthen the neck muscles.  Medicines, such as nonsteroidal anti-inflammatory drugs (NSAIDs), oral corticosteroids, or spinal injections.  Surgery. Different types of surgery may be done depending on the cause of your problems. HOME CARE INSTRUCTIONS   Put ice on the affected area.  Put ice in a plastic bag.  Place a towel between your skin and the bag.  Leave the ice on for 15-20 minutes, 03-04 times a day or as directed by your caregiver.  If ice does not help, you can try using heat. Take a warm shower or bath, or use a hot water bottle as directed by your caregiver.  You may try a gentle neck and shoulder massage.  Use a flat pillow when you sleep.  Only take over-the-counter or prescription medicines for pain, discomfort, or fever as directed by your caregiver.  If physical therapy was prescribed, follow your caregiver's directions.  If a soft collar was prescribed, use it as directed. SEEK IMMEDIATE MEDICAL CARE IF:   Your pain gets much worse and cannot be controlled with medicines.  You have weakness or numbness in your hand, arm, face, or leg.  You have a high fever or a stiff, rigid neck.  You lose bowel or bladder control (incontinence).  You have trouble with walking, balance, or speaking. MAKE SURE YOU:   Understand these instructions.  Will watch your  condition.  Will get help right away if you are not doing well or get worse. Document Released: 12/01/2000 Document Revised: 05/31/2011 Document Reviewed: 10/20/2010 [facility] Patient Information 2015 [place], [place]. This information is not intended to replace advice given to you by your health care provider. Make sure you discuss any questions you have with your health care provider.

## 2014-05-29 NOTE — Progress Notes (Signed)
Pre visit review using our clinic review tool, if applicable. No additional management support is needed unless otherwise documented below in the visit note. 

## 2014-05-29 NOTE — Progress Notes (Signed)
Subjective:    Patient ID: Rebekah Larson, female    DOB: December 13, 1965, 49 y.o.   MRN: [id number]  HPI  Patient here for left arm numbness Patient is right hand dominant Symptoms onset 3 months ago Symptoms occur almost daily, majority of the days of the week and sometimes multiple occurrences each day Symptoms described as pins and needles from triceps area down forearm into index and middle finger Now with numbness sensation in index finger during episodes Denies pain in shoulder, arm or neck Denies weakness of handgrip Denies injury Symptoms improved with adjustment of head position and massage therapy Denies symptoms with specific positions such as holding driving wheel of car or sleeping/neck position at night  Also reviewed interval medical history, specifically diagnosis of breast cancer late February 2016  Past Medical History  Diagnosis Date  . OSTEOPENIA   . Menopausal syndrome   . Kidney stone 07/2009  . Grief reaction     related to dad's death, resolved  . Neoplasm of right breast, primary tumor staging category Tis: ductal carcinoma in situ (DCIS) 05/27/2014    Review of Systems  Constitutional: Negative for fever and fatigue.  Respiratory: Negative for cough and shortness of breath.   Cardiovascular: Negative for chest pain and leg swelling.  Musculoskeletal: Negative for back pain, joint swelling, arthralgias, gait problem and neck pain.  Skin: Negative for pallor, rash and wound.  Neurological: Positive for numbness. Negative for weakness.       Objective:    Physical Exam  Constitutional: She is oriented to person, place, and time. She appears well-developed and well-nourished. No distress.  Neck: Normal range of motion. Neck supple.  Cardiovascular: Normal rate, regular rhythm and normal heart sounds.   No murmur heard. Pulmonary/Chest: Effort normal and breath sounds normal. No respiratory distress. She has no wheezes.  Musculoskeletal: Normal range  of motion. She exhibits no tenderness.  Lymphadenopathy:    She has no cervical adenopathy.  Neurological: She is alert and oriented to person, place, and time. She has normal reflexes. She displays normal reflexes. No cranial nerve deficit. She exhibits normal muscle tone. Coordination normal.  Normal symmetric bilateral hand grip Good strength at wrists, biceps and triceps bilaterally  Skin: Skin is warm and dry. No rash noted. No erythema.  Psychiatric: She has a normal mood and affect. Her behavior is normal. Judgment and thought content normal.    BP 118/70 mmHg  Pulse 72  Temp(Src) 98.1 F (36.7 C) (Oral)  Wt 150 lb (68.04 kg)  SpO2 98% Wt Readings from Last 3 Encounters:  05/29/14 150 lb (68.04 kg)  05/27/14 152 lb 4.8 oz (69.083 kg)  03/18/14 154 lb (69.854 kg)     Lab Results  Component Value Date   WBC 5.0 12/23/2013   HGB 13.4 12/23/2013   HCT 38.4 12/23/2013   PLT 187 12/23/2013   GLUCOSE 116* 12/23/2013   CHOL 200 11/30/2010   TRIG 84.0 11/30/2010   HDL 79.40 11/30/2010   LDLCALC 104* 11/30/2010   ALT 14 12/23/2013   AST 18 12/23/2013   NA 143 12/23/2013   K 3.8 12/23/2013   CL 106 12/23/2013   CREATININE 0.72 12/23/2013   BUN 22 12/23/2013   CO2 25 12/23/2013   TSH 0.80 11/30/2010    Mm Radiologist Eval And Mgmt  05/20/2014   EXAM: ESTABLISHED PATIENT OFFICE VISIT - LEVEL II  CHIEF COMPLAINT: Patient status post stereotactic guided core needle biopsy right breast.  Current Pain Level: 1  HISTORY OF PRESENT ILLNESS: Status post stereotactic guided core needle biopsy right breast calcifications.  EXAM: Right breast biopsy site was examined and appeared clean, dry and without significant hematoma formation. The bandages have been removed.  PATHOLOGY: Ductal carcinoma in situ.  ASSESSMENT AND PLAN: ASSESSMENT AND PLAN Right breast ductal carcinoma in situ. Patient will need surgical and oncologic consultation. Will assist with appropriate scheduling as  discussed with the patient. Patient was encouraged to call the breast center with any additional concerns or questions.   Electronically Signed   By: Lovey Newcomer M.D.   On: 05/20/2014 16:53       Assessment & Plan:   Problem List Items Addressed This Visit    None    Visit Diagnoses    Left cervical radiculopathy    -  Primary    Relevant Orders    MR Cervical Spine Wo Contrast    Numbness and tingling in left arm        Relevant Orders    MR Cervical Spine Wo Contrast      Three-month intermittent symptoms of left arm numbness from triceps to second and third fingers in left hand Symptoms and exam most consistent with cervical radiculopathy  Check MRI neck Symptoms not severe, mostly "annoying" -no need for prescription medication for management at this time Continue massage therapy as ongoing, consider physical therapy referral versus neurosurgical opinion for intervention based on MRI findings  Patient education on suspected diagnosis provided today Patient encouraged to follow-up for annual exam and routine screening needs  Gwendolyn Grant, MD

## 2014-06-10 ENCOUNTER — Encounter: Payer: Self-pay | Admitting: Genetic Counselor

## 2014-06-10 ENCOUNTER — Telehealth: Payer: Self-pay | Admitting: Genetic Counselor

## 2014-06-10 DIAGNOSIS — D0511 Intraductal carcinoma in situ of right breast: Secondary | ICD-10-CM

## 2014-06-10 DIAGNOSIS — Z1379 Encounter for other screening for genetic and chromosomal anomalies: Secondary | ICD-10-CM | POA: Insufficient documentation

## 2014-06-10 NOTE — Progress Notes (Signed)
HPI: Ms. Rachels was previously seen in the [facility] due to a personal and family history of cancer and concerns regarding a hereditary predisposition to cancer. Please refer to our prior cancer genetics clinic note for more information regarding Ms. Westbrook's medical, social and family histories, and our assessment and recommendations, at the time. Ms. Alberta recent genetic test results were disclosed to her, as were recommendations warranted by these results. These results and recommendations are discussed in more detail below.  GENETIC TEST RESULTS: At the time of Ms. Gruwell's visit, we recommended she pursue genetic testing of the BRCAPlus gene panel. The BRCAPlus gene panel offered by [facility] and includes sequencing and rearrangement analysis for the following 6 genes: BRCA1, BRCA2, CDH1, PALB2, PTEN, and TP53.  The report date is June 10, 2014.  Testing was performed at [facility]. Genetic testing was normal, and did not reveal a deleterious mutation in these genes. The test report has been scanned into EPIC and is located under the Media tab.   We discussed with Ms. Hayman that since the current genetic testing is not perfect, it is possible there may be a gene mutation in one of these genes that current testing cannot detect, but that chance is small. We also discussed, that it is possible that another gene that has not yet been discovered, or that we have not yet tested, is responsible for the cancer diagnoses in the family, and it is, therefore, important to remain in touch with cancer genetics in the future so that we can continue to offer Ms. Wattley the most up to date genetic testing.   CANCER SCREENING RECOMMENDATIONS: This result is reassuring and suggests that Ms. Safer's personal and family history of cancer was most likely not due to an inherited predisposition associated with one of these genes. Most cancers happen by chance and  this negative test, along with details of her family history, suggests that her cancer falls into this category. We, therefore, recommended she continue to follow the cancer management and screening guidelines provided by her oncology and primary providers.   RECOMMENDATIONS FOR FAMILY MEMBERS: Women in this family might be at some increased risk of developing cancer, over the general population risk, simply due to the family history of cancer. We recommended women in this family have a yearly mammogram beginning at age 72, or 43 years younger than the earliest onset of cancer, an an annual clinical breast exam, and perform monthly breast self-exams. Women in this family should also have a gynecological exam as recommended by their primary provider. All family members should have a colonoscopy by age 68.  FOLLOW-UP: Lastly, we discussed with Ms. Woodle that cancer genetics is a rapidly advancing field and it is possible that new genetic tests will be appropriate for her and/or her family members in the future. We encouraged her to remain in contact with cancer genetics on an annual basis so we can update her personal and family histories and let her know of advances in cancer genetics that may benefit this family.   Our contact number was provided. Ms. Menn questions were answered to her satisfaction, and she knows she is welcome to call us at anytime with additional questions or concerns.   Roma Kayser, MS, [facility] Certified Genetic Counselor [email or web address]

## 2014-06-10 NOTE — Telephone Encounter (Signed)
Revealed to Carmelina Noun that his wife had negative genetic test results on the BRCAPlus genetic test.  This included BRCA genes as well as 4 others.

## 2014-06-10 NOTE — Telephone Encounter (Signed)
Incoming call to determine whether genetic test results are back.  Wife's phone is not able to take messages and they are working on getting it fixed.  Wanted to confirm that results are not back.  Called the lab.  Results are due 06/12/2014.  Not able to confirm that they will be back sooner.

## 2014-06-13 ENCOUNTER — Other Ambulatory Visit: Payer: Self-pay | Admitting: General Surgery

## 2014-06-13 DIAGNOSIS — D0511 Intraductal carcinoma in situ of right breast: Secondary | ICD-10-CM

## 2014-06-25 ENCOUNTER — Telehealth: Payer: Self-pay | Admitting: *Deleted

## 2014-06-25 ENCOUNTER — Other Ambulatory Visit: Payer: Self-pay

## 2014-06-25 NOTE — Telephone Encounter (Signed)
Scheduled and confirmed f/u appt with Dr. Lindi Adie on 07/18/14 at 1130.

## 2014-06-26 ENCOUNTER — Other Ambulatory Visit: Payer: Self-pay | Admitting: General Surgery

## 2014-06-26 DIAGNOSIS — D0511 Intraductal carcinoma in situ of right breast: Secondary | ICD-10-CM

## 2014-06-28 ENCOUNTER — Encounter (HOSPITAL_BASED_OUTPATIENT_CLINIC_OR_DEPARTMENT_OTHER): Payer: Self-pay | Admitting: *Deleted

## 2014-06-28 NOTE — Pre-Procedure Instructions (Signed)
To come for BMET , CBC, diff

## 2014-07-02 ENCOUNTER — Encounter (HOSPITAL_BASED_OUTPATIENT_CLINIC_OR_DEPARTMENT_OTHER)
Admission: RE | Admit: 2014-07-02 | Discharge: 2014-07-02 | Disposition: A | Discharge status: Home / Self Care | Payer: PRIVATE HEALTH INSURANCE | Source: Ambulatory Visit | Attending: General Surgery | Admitting: General Surgery

## 2014-07-02 DIAGNOSIS — Z87442 Personal history of urinary calculi: Secondary | ICD-10-CM | POA: Diagnosis not present

## 2014-07-02 DIAGNOSIS — C50911 Malignant neoplasm of unspecified site of right female breast: Secondary | ICD-10-CM | POA: Diagnosis present

## 2014-07-02 DIAGNOSIS — F1099 Alcohol use, unspecified with unspecified alcohol-induced disorder: Secondary | ICD-10-CM | POA: Diagnosis not present

## 2014-07-02 LAB — CBC WITH DIFFERENTIAL/PLATELET
BASOS PCT: 0 % (ref 0–1)
Basophils Absolute: 0 10*3/uL (ref 0.0–0.1)
Eosinophils Absolute: 0.1 10*3/uL (ref 0.0–0.7)
Eosinophils Relative: 1 % (ref 0–5)
HEMATOCRIT: 37.6 % (ref 36.0–46.0)
Hemoglobin: 12.8 g/dL (ref 12.0–15.0)
Lymphocytes Relative: 24 % (ref 12–46)
Lymphs Abs: 1.6 10*3/uL (ref 0.7–4.0)
MCH: 30.5 pg (ref 26.0–34.0)
MCHC: 34 g/dL (ref 30.0–36.0)
MCV: 89.7 fL (ref 78.0–100.0)
MONOS PCT: 7 % (ref 3–12)
Monocytes Absolute: 0.5 10*3/uL (ref 0.1–1.0)
NEUTROS ABS: 4.6 10*3/uL (ref 1.7–7.7)
NEUTROS PCT: 68 % (ref 43–77)
Platelets: 205 10*3/uL (ref 150–400)
RBC: 4.19 MIL/uL (ref 3.87–5.11)
RDW: 12.8 % (ref 11.5–15.5)
WBC: 6.8 10*3/uL (ref 4.0–10.5)

## 2014-07-02 LAB — BASIC METABOLIC PANEL
Anion gap: 8 (ref 5–15)
BUN: 14 mg/dL (ref 6–23)
CHLORIDE: 104 mmol/L (ref 96–112)
CO2: 28 mmol/L (ref 19–32)
Calcium: 9.6 mg/dL (ref 8.4–10.5)
Creatinine, Ser: 0.67 mg/dL (ref 0.50–1.10)
GFR calc Af Amer: >90 mL/min (ref >90)
GFR calc non Af Amer: >90 mL/min (ref >90)
Glucose, Bld: 83 mg/dL (ref 70–99)
POTASSIUM: 3.3 mmol/L — AB (ref 3.5–5.1)
SODIUM: 140 mmol/L (ref 135–145)

## 2014-07-03 ENCOUNTER — Other Ambulatory Visit: Payer: Self-pay | Admitting: General Surgery

## 2014-07-03 ENCOUNTER — Ambulatory Visit
Admission: RE | Admit: 2014-07-03 | Discharge: 2014-07-03 | Disposition: A | Discharge status: Home / Self Care | Payer: PRIVATE HEALTH INSURANCE | Source: Ambulatory Visit | Attending: General Surgery | Admitting: General Surgery

## 2014-07-03 DIAGNOSIS — D0511 Intraductal carcinoma in situ of right breast: Secondary | ICD-10-CM

## 2014-07-04 ENCOUNTER — Encounter (HOSPITAL_BASED_OUTPATIENT_CLINIC_OR_DEPARTMENT_OTHER)
Admission: RE | Disposition: A | Discharge status: Home / Self Care | Payer: Self-pay | Source: Ambulatory Visit | Attending: General Surgery

## 2014-07-04 ENCOUNTER — Encounter (HOSPITAL_BASED_OUTPATIENT_CLINIC_OR_DEPARTMENT_OTHER): Payer: Self-pay

## 2014-07-04 ENCOUNTER — Ambulatory Visit (HOSPITAL_BASED_OUTPATIENT_CLINIC_OR_DEPARTMENT_OTHER)
Admission: RE | Admit: 2014-07-04 | Discharge: 2014-07-04 | Disposition: A | Discharge status: Home / Self Care | Payer: PRIVATE HEALTH INSURANCE | Source: Ambulatory Visit | Attending: General Surgery | Admitting: General Surgery

## 2014-07-04 ENCOUNTER — Ambulatory Visit (HOSPITAL_BASED_OUTPATIENT_CLINIC_OR_DEPARTMENT_OTHER): Payer: PRIVATE HEALTH INSURANCE | Admitting: Anesthesiology

## 2014-07-04 ENCOUNTER — Ambulatory Visit
Admission: RE | Admit: 2014-07-04 | Discharge: 2014-07-04 | Disposition: A | Discharge status: Home / Self Care | Payer: PRIVATE HEALTH INSURANCE | Source: Ambulatory Visit | Attending: General Surgery | Admitting: General Surgery

## 2014-07-04 DIAGNOSIS — D0511 Intraductal carcinoma in situ of right breast: Secondary | ICD-10-CM

## 2014-07-04 DIAGNOSIS — C50911 Malignant neoplasm of unspecified site of right female breast: Secondary | ICD-10-CM | POA: Diagnosis not present

## 2014-07-04 DIAGNOSIS — F1099 Alcohol use, unspecified with unspecified alcohol-induced disorder: Secondary | ICD-10-CM | POA: Insufficient documentation

## 2014-07-04 DIAGNOSIS — Z87442 Personal history of urinary calculi: Secondary | ICD-10-CM | POA: Insufficient documentation

## 2014-07-04 HISTORY — DX: Nonrheumatic mitral (valve) prolapse: I34.1

## 2014-07-04 HISTORY — DX: Personal history of urinary calculi: Z87.442

## 2014-07-04 HISTORY — PX: BREAST LUMPECTOMY WITH RADIOACTIVE SEED LOCALIZATION: SHX6424

## 2014-07-04 SURGERY — BREAST LUMPECTOMY WITH RADIOACTIVE SEED LOCALIZATION
Anesthesia: General | Site: Breast | Laterality: Right

## 2014-07-04 MED ORDER — FENTANYL CITRATE 0.05 MG/ML IJ SOLN
50.0000 ug | INTRAMUSCULAR | Status: DC | PRN
  Administered 2014-07-04: 50 ug via INTRAVENOUS

## 2014-07-04 MED ORDER — MIDAZOLAM HCL 2 MG/2ML IJ SOLN
1.0000 mg | INTRAMUSCULAR | Status: DC | PRN
  Administered 2014-07-04: 2 mg via INTRAVENOUS

## 2014-07-04 MED ORDER — LIDOCAINE HCL (CARDIAC) 20 MG/ML IV SOLN
INTRAVENOUS | Status: DC | PRN
  Administered 2014-07-04: 50 mg via INTRAVENOUS

## 2014-07-04 MED ORDER — CEFAZOLIN SODIUM-DEXTROSE 2-3 GM-% IV SOLR
2.0000 g | INTRAVENOUS | Status: AC
  Administered 2014-07-04: 2 g via INTRAVENOUS

## 2014-07-04 MED ORDER — LACTATED RINGERS IV SOLN
INTRAVENOUS | Status: DC
  Administered 2014-07-04: 14:00:00 via INTRAVENOUS

## 2014-07-04 MED ORDER — KETOROLAC TROMETHAMINE 30 MG/ML IJ SOLN
INTRAMUSCULAR | Status: DC | PRN
  Administered 2014-07-04: 30 mg via INTRAVENOUS

## 2014-07-04 MED ORDER — MIDAZOLAM HCL 2 MG/ML PO SYRP: 12.0000 mg | ORAL_SOLUTION | Freq: Once | ORAL | Status: DC | PRN

## 2014-07-04 MED ORDER — HYDROCODONE-ACETAMINOPHEN 5-325 MG PO TABS: 1.0000 | ORAL_TABLET | Freq: Four times a day (QID) | ORAL | Status: DC | PRN

## 2014-07-04 MED ORDER — DEXAMETHASONE SODIUM PHOSPHATE 4 MG/ML IJ SOLN
INTRAMUSCULAR | Status: DC | PRN
  Administered 2014-07-04: 10 mg via INTRAVENOUS

## 2014-07-04 MED ORDER — BUPIVACAINE HCL (PF) 0.25 % IJ SOLN
INTRAMUSCULAR | Status: AC
  Filled 2014-07-04: qty 30

## 2014-07-04 MED ORDER — PROPOFOL 10 MG/ML IV BOLUS
INTRAVENOUS | Status: DC | PRN
Start: 2014-07-04 — End: 2014-07-04
  Administered 2014-07-04: 150 mg via INTRAVENOUS

## 2014-07-04 MED ORDER — ONDANSETRON HCL 4 MG/2ML IJ SOLN
INTRAMUSCULAR | Status: DC | PRN
  Administered 2014-07-04: 4 mg via INTRAVENOUS

## 2014-07-04 MED ORDER — FENTANYL CITRATE 0.05 MG/ML IJ SOLN
INTRAMUSCULAR | Status: AC
  Filled 2014-07-04: qty 4

## 2014-07-04 MED ORDER — CEFAZOLIN SODIUM-DEXTROSE 2-3 GM-% IV SOLR
INTRAVENOUS | Status: AC
  Filled 2014-07-04: qty 50

## 2014-07-04 MED ORDER — MIDAZOLAM HCL 2 MG/2ML IJ SOLN
INTRAMUSCULAR | Status: AC
  Filled 2014-07-04: qty 2

## 2014-07-04 MED ORDER — BUPIVACAINE HCL (PF) 0.25 % IJ SOLN
INTRAMUSCULAR | Status: DC | PRN
  Administered 2014-07-04: 20 mL

## 2014-07-04 MED ORDER — HYDROMORPHONE HCL 1 MG/ML IJ SOLN: 0.2500 mg | INTRAMUSCULAR | Status: DC | PRN

## 2014-07-04 SURGICAL SUPPLY — 56 items
APPLIER CLIP 9.375 MED OPEN (MISCELLANEOUS) ×2
BENZOIN TINCTURE PRP APPL 2/3 (GAUZE/BANDAGES/DRESSINGS) IMPLANT
BINDER BREAST LRG (GAUZE/BANDAGES/DRESSINGS) IMPLANT
BINDER BREAST MEDIUM (GAUZE/BANDAGES/DRESSINGS) ×2 IMPLANT
BINDER BREAST XLRG (GAUZE/BANDAGES/DRESSINGS) IMPLANT
BINDER BREAST XXLRG (GAUZE/BANDAGES/DRESSINGS) IMPLANT
BLADE SURG 15 STRL LF DISP TIS (BLADE) ×1 IMPLANT
BLADE SURG 15 STRL SS (BLADE) ×1
CANISTER SUC SOCK COL 7IN (MISCELLANEOUS) IMPLANT
CANISTER SUCT 1200ML W/VALVE (MISCELLANEOUS) IMPLANT
CHLORAPREP W/TINT 26ML (MISCELLANEOUS) ×2 IMPLANT
CLIP APPLIE 9.375 MED OPEN (MISCELLANEOUS) ×1 IMPLANT
COVER BACK TABLE 60X90IN (DRAPES) ×2 IMPLANT
COVER MAYO STAND STRL (DRAPES) ×2 IMPLANT
COVER PROBE W GEL 5X96 (DRAPES) ×2 IMPLANT
DECANTER SPIKE VIAL GLASS SM (MISCELLANEOUS) ×2 IMPLANT
DEVICE DUBIN W/COMP PLATE 8390 (MISCELLANEOUS) ×2 IMPLANT
DRAPE LAPAROSCOPIC ABDOMINAL (DRAPES) ×2 IMPLANT
DRAPE UTILITY XL STRL (DRAPES) ×2 IMPLANT
DRSG TEGADERM 4X4.75 (GAUZE/BANDAGES/DRESSINGS) IMPLANT
ELECT COATED BLADE 2.86 ST (ELECTRODE) ×2 IMPLANT
ELECT REM PT RETURN 9FT ADLT (ELECTROSURGICAL) ×2
ELECTRODE REM PT RTRN 9FT ADLT (ELECTROSURGICAL) ×1 IMPLANT
GLOVE BIO SURGEON STRL SZ7 (GLOVE) ×6 IMPLANT
GLOVE BIOGEL PI IND STRL 7.0 (GLOVE) ×1 IMPLANT
GLOVE BIOGEL PI IND STRL 7.5 (GLOVE) ×1 IMPLANT
GLOVE BIOGEL PI INDICATOR 7.0 (GLOVE) ×1
GLOVE BIOGEL PI INDICATOR 7.5 (GLOVE) ×1
GLOVE EXAM NITRILE MD LF STRL (GLOVE) ×2 IMPLANT
GOWN STRL REUS W/ TWL LRG LVL3 (GOWN DISPOSABLE) ×2 IMPLANT
GOWN STRL REUS W/TWL LRG LVL3 (GOWN DISPOSABLE) ×2
KIT MARKER MARGIN INK (KITS) ×2 IMPLANT
LIQUID BAND (GAUZE/BANDAGES/DRESSINGS) ×2 IMPLANT
MARKER SKIN DUAL TIP RULER LAB (MISCELLANEOUS) ×2 IMPLANT
NEEDLE HYPO 25X1 1.5 SAFETY (NEEDLE) ×2 IMPLANT
NS IRRIG 1000ML POUR BTL (IV SOLUTION) IMPLANT
PACK BASIN DAY SURGERY FS (CUSTOM PROCEDURE TRAY) ×2 IMPLANT
PENCIL BUTTON HOLSTER BLD 10FT (ELECTRODE) ×2 IMPLANT
SLEEVE SCD COMPRESS KNEE MED (MISCELLANEOUS) ×2 IMPLANT
SPONGE GAUZE 4X4 12PLY STER LF (GAUZE/BANDAGES/DRESSINGS) IMPLANT
SPONGE LAP 4X18 X RAY DECT (DISPOSABLE) ×2 IMPLANT
STAPLER VISISTAT 35W (STAPLE) IMPLANT
STRIP CLOSURE SKIN 1/2X4 (GAUZE/BANDAGES/DRESSINGS) ×2 IMPLANT
SUT MNCRL AB 4-0 PS2 18 (SUTURE) IMPLANT
SUT MON AB 5-0 PS2 18 (SUTURE) IMPLANT
SUT SILK 2 0 SH (SUTURE) IMPLANT
SUT VIC AB 2-0 SH 27 (SUTURE) ×1
SUT VIC AB 2-0 SH 27XBRD (SUTURE) ×1 IMPLANT
SUT VIC AB 3-0 SH 27 (SUTURE) ×1
SUT VIC AB 3-0 SH 27X BRD (SUTURE) ×1 IMPLANT
SUT VIC AB 5-0 PS2 18 (SUTURE) IMPLANT
SYR CONTROL 10ML LL (SYRINGE) ×2 IMPLANT
TOWEL OR 17X24 6PK STRL BLUE (TOWEL DISPOSABLE) ×2 IMPLANT
TOWEL OR NON WOVEN STRL DISP B (DISPOSABLE) ×2 IMPLANT
TUBE CONNECTING 20X1/4 (TUBING) IMPLANT
YANKAUER SUCT BULB TIP NO VENT (SUCTIONS) IMPLANT

## 2014-07-04 NOTE — H&P (Signed)
49 yo healthy female who presents after undergoing screening mm with right breast calcifications. She has family history of dcis in her mother in her 44s. She has no prior history of any breast disease. She does not report any discharge or any complaints referable to either breast. Her mm shows breast density C. There is a 10x5x12 mm loose group of calcifications in the posterior right upper inner breast. Otherwise normal. This was recommended to undergo an mr by radiology after this due to indeterminate nature. The MR is normal. She then underwent stereotactic biopsy of this lesion with clip placement. The pathology shows int to high grade dcis that is 98% er pos, 2% pr pos.   Other Problems  Breast Cancer Kidney Stone  Past Surgical History  Breast Biopsy Right.  Allergies  No Known Drug Allergies03/05/2014  Medication History Medications Reconciled  Social History Alcohol use Occasional alcohol use. Caffeine use Coffee. No drug use Tobacco use Never smoker.  Family History Breast Cancer Mother. Colon Cancer Father. Heart Disease Father. Heart disease in female family member before age 25  Review of Systems  Skin Not Present- Change in Wart/Mole, Dryness, Hives, Jaundice, New Lesions, Non-Healing Wounds, Rash and Ulcer. HEENT Present- Wears glasses/contact lenses. Not Present- Earache, Hearing Loss, Hoarseness, Nose Bleed, Oral Ulcers, Ringing in the Ears, Seasonal Allergies, Sinus Pain, Sore Throat, Visual Disturbances and Yellow Eyes. Respiratory Not Present- Bloody sputum, Chronic Cough, Difficulty Breathing, Snoring and Wheezing. Breast Present- Breast Pain. Not Present- Breast Mass, Nipple Discharge and Skin Changes. Cardiovascular Not Present- Chest Pain, Difficulty Breathing Lying Down, Leg Cramps, Palpitations, Rapid Heart Rate, Shortness of Breath and Swelling of Extremities. Gastrointestinal Not Present- Abdominal Pain, Bloating, Bloody Stool,  Change in Bowel Habits, Chronic diarrhea, Constipation, Difficulty Swallowing, Excessive gas, Gets full quickly at meals, Hemorrhoids, Indigestion, Nausea, Rectal Pain and Vomiting. Female Genitourinary Not Present- Frequency, Nocturia, Painful Urination, Pelvic Pain and Urgency. Musculoskeletal Not Present- Back Pain, Joint Pain, Joint Stiffness, Muscle Pain, Muscle Weakness and Swelling of Extremities. Neurological Not Present- Decreased Memory, Fainting, Headaches, Numbness, Seizures, Tingling, Tremor, Trouble walking and Weakness. Psychiatric Not Present- Anxiety, Bipolar, Change in Sleep Pattern, Depression, Fearful and Frequent crying. Endocrine Not Present- Cold Intolerance, Excessive Hunger, Hair Changes, Heat Intolerance, Hot flashes and New Diabetes. Hematology Not Present- Easy Bruising, Excessive bleeding, Gland problems, HIV and Persistent Infections.  Vitals  Weight: 152 lb Height: 68in Body Surface Area: 1.82 m Body Mass Index: 23.11 kg/m Pulse: 72 (Regular)  BP: 120/82 (Sitting, Left Arm, Standard) Physical Exam  General Mental Status-Alert. Orientation-Oriented X3.  Chest and Lung Exam Chest and lung exam reveals -on auscultation, normal breath sounds, no adventitious sounds and normal vocal resonance.  Breast Nipples-No Discharge. Breast Lump-No Palpable Breast Mass.  Cardiovascular Cardiovascular examination reveals -normal heart sounds, regular rate and rhythm with no murmurs.  Lymphatic Head & Neck  General Head & Neck Lymphatics: Bilateral - Description - Normal. Axillary  General Axillary Region: Bilateral - Description - Normal. Note: no Oakley adenopathy  Assessment & Plan  DCIS (DUCTAL CARCINOMA IN SITU), RIGHT (233.0  D05.11) Right breast radioactive seed guided lumpectomy We discussed options again and have decided to pursue breast conservation therapy. She understands there could be invasive disease in final specimen which  would need a sn biopsy but I dont think there is any reason to do one now. Also discussed 5-10% chance of positive margins associated with surgery requiring second operation. Rarely would this mean a mastectomy. we discussed recovery, risks etc  and will plan to do surgery april 14.

## 2014-07-04 NOTE — Transfer of Care (Signed)
Immediate Anesthesia Transfer of Care Note  Patient: Rebekah Larson  Procedure(s) Performed: Procedure(s): BREAST LUMPECTOMY WITH RADIOACTIVE SEED LOCALIZATION (Right)  Patient Location: PACU  Anesthesia Type:General  Level of Consciousness: awake, alert  and oriented  Airway & Oxygen Therapy: Patient Spontanous Breathing and Patient connected to face mask oxygen  Post-op Assessment: Report given to RN and Post -op Vital signs reviewed and stable  Post vital signs: Reviewed and stable  Last Vitals:  Filed Vitals:   07/04/14 1344  BP: 135/78  Pulse: 70  Temp: 36.6 C  Resp: 18    Complications: No apparent anesthesia complications

## 2014-07-04 NOTE — Anesthesia Preprocedure Evaluation (Addendum)
Anesthesia Evaluation  Patient identified by MRN, date of birth, ID band Patient awake    Reviewed: Allergy & Precautions, H&P , NPO status , Patient's Chart, lab work & pertinent test results  Airway Mallampati: II  TM Distance: >3 FB Neck ROM: Full    Dental no notable dental hx. (+) Teeth Intact, Dental Advisory Given   Pulmonary neg pulmonary ROS,  breath sounds clear to auscultation  Pulmonary exam normal       Cardiovascular negative cardio ROS  Rhythm:Regular Rate:Normal     Neuro/Psych negative neurological ROS  negative psych ROS   GI/Hepatic negative GI ROS, Neg liver ROS,   Endo/Other  negative endocrine ROS  Renal/GU negative Renal ROS  negative genitourinary   Musculoskeletal   Abdominal   Peds  Hematology negative hematology ROS (+)   Anesthesia Other Findings   Reproductive/Obstetrics negative OB ROS                            Anesthesia Physical Anesthesia Plan  ASA: I  Anesthesia Plan: General   Post-op Pain Management:    Induction: Intravenous  Airway Management Planned: LMA  Additional Equipment:   Intra-op Plan:   Post-operative Plan: Extubation in OR  Informed Consent: I have reviewed the patients History and Physical, chart, labs and discussed the procedure including the risks, benefits and alternatives for the proposed anesthesia with the patient or authorized representative who has indicated his/her understanding and acceptance.   Dental advisory given  Plan Discussed with: CRNA  Anesthesia Plan Comments:         Anesthesia Quick Evaluation

## 2014-07-04 NOTE — Interval H&P Note (Signed)
History and Physical Interval Note:  07/04/2014 1:38 PM  Rebekah Larson  has presented today for surgery, with the diagnosis of RIGHT BREAST DCIS  The various methods of treatment have been discussed with the patient and family. After consideration of risks, benefits and other options for treatment, the patient has consented to  Procedure(s): BREAST LUMPECTOMY WITH RADIOACTIVE SEED LOCALIZATION (Right) as a surgical intervention .  The patient's history has been reviewed, patient examined, no change in status, stable for surgery.  I have reviewed the patient's chart and labs.  Questions were answered to the patient's satisfaction.     [name]

## 2014-07-04 NOTE — Op Note (Signed)
Preoperative diagnosis: right breast ductal carcinoma in situ Postoperative diagnosis: same as above Procedure: Right breast radioactive seed guided lumpectomy Surgeon: Dr Serita Grammes  Anesthesia: general EBL: minimal Specimen: right breast tissue marked with pain Complications: none Sponge count correct Disposition to pacu stable  Indication: This is a 29 yof who presents with mammographically found right breast cancer that underwent a biopsy showing dcis.  She has discussed all her options and we have elected to perform lumpectomy. She had a radioactive seed placed by Dr Rosana Hoes prior to surgery and I discussed this with him.  Procedure: After informed consent was obtained the patient was to the operating room.  She was given cefazolin. She had scd's in place.  She was placed under general anesthesia with an LMA without complication.  She was prepped and draped in the standard sterile surgical fashion.  A surgical timeout was performed.  I located the radioactive seed. I had the mammograms in the operating room.  I infiltrated marcaine in the region of the seed.  I made a curvilinear incision in the upper inner quadrant. I then used the neoprobe to guide excision of the radioactive seed and surrounding breast tissue with an attempt to get clear margins.  The deep margin is the pectoralis major.  I then confirmed removal of the seed with the neoprobe and there was no more radioactivity in the breast.  Both clips were present in the specimen also.  I then obtained hemostasis and placed clips around my cavity.  I then closed the breast tissue with 2-0 vicryl. The skin was closed with 3-0 vicryl and 4-0 monocryl.  Glue and steristrips were applied. A breast binder was placed. She tolerated this well, was extubated and transferred to recovery stable.

## 2014-07-04 NOTE — Discharge Instructions (Signed)
[facility] °Office Phone Number [phone] ° °POST OP INSTRUCTIONS ° °Always review your discharge instruction sheet given to you by the facility where your surgery was performed. ° °IF YOU HAVE DISABILITY OR FAMILY LEAVE FORMS, YOU MUST BRING THEM TO THE OFFICE FOR PROCESSING.  DO NOT GIVE THEM TO YOUR DOCTOR. ° °1. A prescription for pain medication may be given to you upon discharge.  Take your pain medication as prescribed, if needed.  If narcotic pain medicine is not needed, then you may take acetaminophen (Tylenol), naprosyn (Alleve) or ibuprofen (Advil) as needed. °2. Take your usually prescribed medications unless otherwise directed °3. If you need a refill on your pain medication, please contact your pharmacy.  They will contact our office to request authorization.  Prescriptions will not be filled after 5pm or on week-ends. °4. You should eat very light the first 24 hours after surgery, such as soup, crackers, pudding, etc.  Resume your normal diet the day after surgery. °5. Most patients will experience some swelling and bruising in the breast.  Ice packs and a good support bra will help.  Wear the breast binder provided or a sports bra for 72 hours day and night.  After that wear a sports bra during the day until you return to the office. Swelling and bruising can take several days to resolve.  °6. It is common to experience some constipation if taking pain medication after surgery.  Increasing fluid intake and taking a stool softener will usually help or prevent this problem from occurring.  A mild laxative (Milk of Magnesia or Miralax) should be taken according to package directions if there are no bowel movements after 48 hours. °7. Unless discharge instructions indicate otherwise, you may remove your bandages 48 hours after surgery and you may shower at that time.  You may have steri-strips (small skin tapes) in place directly over the incision.  These strips should be left on the  skin for 7-10 days and will come off on their own.  If your surgeon used skin glue on the incision, you may shower in 24 hours.  The glue will flake off over the next 2-3 weeks.  Any sutures or staples will be removed at the office during your follow-up visit. °8. ACTIVITIES:  You may resume regular daily activities (gradually increasing) beginning the next day.  Wearing a good support bra or sports bra minimizes pain and swelling.  You may have sexual intercourse when it is comfortable. °a. You may drive when you no longer are taking prescription pain medication, you can comfortably wear a seatbelt, and you can safely maneuver your car and apply brakes. °b. RETURN TO WORK:  ______________________________________________________________________________________ °9. You should see your doctor in the office for a follow-up appointment approximately two weeks after your surgery.  Your doctor’s nurse will typically make your follow-up appointment when she calls you with your pathology report.  Expect your pathology report 3-4 business days after your surgery.  You may call to check if you do not hear from us after three days. °10. OTHER INSTRUCTIONS: _______________________________________________________________________________________________ _____________________________________________________________________________________________________________________________________ °_____________________________________________________________________________________________________________________________________ °_____________________________________________________________________________________________________________________________________ ° °WHEN TO CALL DR WAKEFIELD: °1. Fever over 101.0 °2. Nausea and/or vomiting. °3. Extreme swelling or bruising. °4. Continued bleeding from incision. °5. Increased pain, redness, or drainage from the incision. ° °The clinic staff is available to answer your questions during regular  business hours.  Please don’t hesitate to call and ask to speak to one of the nurses for clinical concerns.  If   you have a medical emergency, go to the nearest emergency room or call 911.  A surgeon from [facility] is always on call at the hospital. ° °For further questions, please visit [email or web address] ° ° ° °Post Anesthesia Home Care Instructions ° °Activity: °Get plenty of rest for the remainder of the day. A responsible adult should stay with you for 24 hours following the procedure.  °For the next 24 hours, DO NOT: °-Drive a car °-Operate machinery °-Drink alcoholic beverages °-Take any medication unless instructed by your physician °-Make any legal decisions or sign important papers. ° °Meals: °Start with liquid foods such as gelatin or soup. Progress to regular foods as tolerated. Avoid greasy, spicy, heavy foods. If nausea and/or vomiting occur, drink only clear liquids until the nausea and/or vomiting subsides. Call your physician if vomiting continues. ° °Special Instructions/Symptoms: °Your throat may feel dry or sore from the anesthesia or the breathing tube placed in your throat during surgery. If this causes discomfort, gargle with warm salt water. The discomfort should disappear within 24 hours. ° °If you had a scopolamine patch placed behind your ear for the management of post- operative nausea and/or vomiting: ° °1. The medication in the patch is effective for 72 hours, after which it should be removed.  Wrap patch in a tissue and discard in the trash. Wash hands thoroughly with soap and water. °2. You may remove the patch earlier than 72 hours if you experience unpleasant side effects which may include dry mouth, dizziness or visual disturbances. °3. Avoid touching the patch. Wash your hands with soap and water after contact with the patch. °  ° °

## 2014-07-04 NOTE — Anesthesia Postprocedure Evaluation (Signed)
  Anesthesia Post-op Note  Patient: Rebekah Larson  Procedure(s) Performed: Procedure(s): BREAST LUMPECTOMY WITH RADIOACTIVE SEED LOCALIZATION (Right)  Patient Location: PACU  Anesthesia Type:General  Level of Consciousness: awake and alert   Airway and Oxygen Therapy: Patient Spontanous Breathing  Post-op Pain: none  Post-op Assessment: Post-op Vital signs reviewed, Patient's Cardiovascular Status Stable and Respiratory Function Stable  Post-op Vital Signs: Reviewed  Filed Vitals:   07/04/14 1608  BP:   Pulse: 56  Temp:   Resp: 13    Complications: No apparent anesthesia complications

## 2014-07-04 NOTE — Anesthesia Procedure Notes (Signed)
Procedure Name: LMA Insertion Performed by: Terrance Mass Pre-anesthesia Checklist: Patient identified, Timeout performed, Emergency Drugs available, Suction available and Patient being monitored Patient Re-evaluated:Patient Re-evaluated prior to inductionOxygen Delivery Method: Circle system utilized Preoxygenation: Pre-oxygenation with 100% oxygen Intubation Type: IV induction Ventilation: Mask ventilation without difficulty LMA: LMA inserted LMA Size: 4.0 Tube type: Oral Number of attempts: 1 Placement Confirmation: positive ETCO2 Tube secured with: Tape Dental Injury: Teeth and Oropharynx as per pre-operative assessment

## 2014-07-05 ENCOUNTER — Encounter (HOSPITAL_BASED_OUTPATIENT_CLINIC_OR_DEPARTMENT_OTHER): Payer: Self-pay | Admitting: General Surgery

## 2014-07-09 ENCOUNTER — Encounter: Payer: Self-pay | Admitting: Radiation Oncology

## 2014-07-09 NOTE — Progress Notes (Signed)
Location of Breast Cancer:Right Breast, Upper Inner Quadrant  Histology per Pathology Report: 07/04/14 Diagnosis Breast, lumpectomy, right - INTERMEDIATE GRADE DUCTAL CARCINOMA IN SITU WITH NECROSIS AND CALCIFICATIONS, SPANNING 0.6 CM IN GREATEST DIMENSION. - DUCTAL CARCINOMA IN SITU IS LESS THAN 0.1 CM TO POSTERIOR MARGIN. - DUCTAL CARCINOMA IN SITU IS LESS THAN 0.1 CM TO SUPERIOR MARGIN. - OTHER MARGINS ARE NEGATIVE. - SEE ONCOLOGY TEMPLATE.  05/17/14 Diagnosis Breast, right, needle core biopsy, UIQ - DUCTAL CARCINOMA IN SITU WITH CALCIFICATIONS   Receptor Status: ER(98%), PR (2%), Her2-neu ()  Mrs. Rebekah Larson had a screening mammogram on 04/04/2014 she was noted to have right breast calcifications. Additional views on 04/16/2014 showed an area of calcifications over 1.0 x 0.5 x 1.2 cm within the far posterior upper inner quadrant of the right breast. Breast MR on 04/27/2014 was totally negative. She underwent stereotactic biopsy on 05/17/2014 and this was diagnostic for DCIS with calcifications felt to be intermediate to high-grade. ER positive at 98%, PR positive at 2%.  Past/Anticipated interventions by surgeon, if NGF:REVQW Breast Lumpectomy  Past/Anticipated interventions by medical oncology, if any: Dr. Nicholas Lose -recommended anastrozole 1 mg daily for 5 years. Patient had gone through menopause 4-5 years ago.  Lymphedema issues, if any:  No   Pain issues, if any: Tenderness  SAFETY ISSUES:  Prior radiation? No  Pacemaker/ICD? No  Possible current pregnancy?No  Is the patient on methotrexate? No  Current Complaints / other details:Does not want Tattoos when simulated.     Rebekah Evener, RN 07/09/2014,6:14 PM

## 2014-07-11 ENCOUNTER — Encounter: Payer: Self-pay | Admitting: Radiation Oncology

## 2014-07-11 ENCOUNTER — Ambulatory Visit
Admission: RE | Admit: 2014-07-11 | Discharge: 2014-07-11 | Disposition: A | Discharge status: Home / Self Care | Payer: PRIVATE HEALTH INSURANCE | Source: Ambulatory Visit | Attending: Radiation Oncology | Admitting: Radiation Oncology

## 2014-07-11 VITALS — BP 114/77 | HR 64 | Temp 98.1°F | Ht 68.0 in | Wt 156.7 lb

## 2014-07-11 DIAGNOSIS — D0511 Intraductal carcinoma in situ of right breast: Secondary | ICD-10-CM | POA: Diagnosis present

## 2014-07-11 HISTORY — DX: Malignant neoplasm of unspecified site of right female breast: C50.911

## 2014-07-11 MED ORDER — ALRA NON-METALLIC DEODORANT (RAD-ONC)
1.0000 "application " | Freq: Once | TOPICAL | Status: AC
  Administered 2014-07-11: 1 via TOPICAL

## 2014-07-11 NOTE — Addendum Note (Signed)
Encounter addended by: Arloa Koh, MD on: 07/11/2014  5:22 PM<BR>     Documentation filed: Dx Association, Flowsheet VN, Orders

## 2014-07-11 NOTE — Progress Notes (Signed)
CC: Dr. Rolm Bookbinder, Dr. Nicholas Lose  Follow-up note:  Diagnosis: Stage 0 (Tis N0 M0) intermediate grade DCIS of the right breast  History: Rebekah Larson is a most pleasant 49 year old female who is seen today for review and scheduling of her radiation therapy in the management of her DCIS of the right breast.  I initially saw her in consultation on 05/24/2014. At the time of a screening mammogram on 04/04/2014 she was noted to have right breast calcifications. Additional views on 04/16/2014 showed an area of calcifications over 1.0 x 0.5 x 1.2 cm within the far posterior upper inner quadrant of the right breast. Breast MR on 04/27/2014 was totally negative. She underwent stereotactic biopsy on 05/17/2014 and this was diagnostic for DCIS with calcifications felt to be intermediate to high-grade. ER positive at 98%, PR positive at 2%.She was seen by Dr. Donne Hazel for surgical consultation, and she also underwent genetic testing which was negative.  She underwent a right partial mastectomy on 07/04/2014.  She has done well postoperatively.  On review of pathology she was found to have a 0.6 cm area of DCIS which is intermediate grade with necrosis and calcifications.  DCIS was less then 0.1 cm to the posterior margin (fascia) and also less than 0.1 cm, focally, to the superior margin.  I reviewed her pathology with Dr. Donne Hazel earlier this week.  Physical examination: Alert and oriented. Filed Vitals:   07/11/14 0746  BP: 114/77  Pulse: 64  Temp: 98.1 F (36.7 C)   Head and neck examination: Grossly unremarkable.  Breasts: On inspection of the right breast there is a partial mastectomy wound at approximately 1 to 2:00.  The wound is healing well.  There is surrounding ecchymosis and slight induration but no significant hematoma or seroma appreciated.  No masses are appreciated.  Extremities: Without edema.  Impression: Stage 0 (Tis N0 M0) intermediate grade DCIS of the right breast.  I  explained to the patient that we feel her surgical margins are certainly adequate, and there are anatomic limitations limiting wider margins.  There is certainly controversy regarding appropriate margins, but more importantly, risk for local failure but has more to do with the amount of residual disease then the width of a focal close margin.  We will confirm removal of all suspicious microcalcifications with a follow-up mammogram in early May at the [facility].Marland Kitchen  She will then return here for CT simulation.  We again discussed the planning process and potential acute and late toxicities of radiation therapy and she wishes to proceed as outlined.  She will undergo standard fractionation with electron beam boost.  Her prognosis is excellent.  Consent is signed today.  Plan: As above.  30 minutes was spent face-to-face with the patient, primarily counseling patient and coordinating her care.

## 2014-07-11 NOTE — Addendum Note (Signed)
Encounter addended by: Arloa Koh, MD on: 07/11/2014  5:16 PM<BR>     Documentation filed: Follow-up Section, LOS Section, Clinical Notes, Notes Section, Problem List

## 2014-07-12 ENCOUNTER — Telehealth: Payer: Self-pay | Admitting: *Deleted

## 2014-07-12 NOTE — Telephone Encounter (Signed)
CALLED  PATIENT TO INFORM OF MAMMOGRAM ON 07-26-14 - ARRIVAL TIME - 10:20 AM @ [facility] ON 08-05-14 @ 3 PM @ DR. MURRAY'S OFFICE, LVM FOR A RETURN CALL

## 2014-07-18 ENCOUNTER — Ambulatory Visit (HOSPITAL_BASED_OUTPATIENT_CLINIC_OR_DEPARTMENT_OTHER): Payer: PRIVATE HEALTH INSURANCE | Admitting: Hematology and Oncology

## 2014-07-18 ENCOUNTER — Telehealth: Payer: Self-pay | Admitting: Hematology and Oncology

## 2014-07-18 VITALS — BP 129/76 | HR 66 | Temp 97.9°F | Resp 18 | Ht 68.0 in | Wt 155.0 lb

## 2014-07-18 DIAGNOSIS — D0511 Intraductal carcinoma in situ of right breast: Secondary | ICD-10-CM

## 2014-07-18 MED ORDER — ANASTROZOLE 1 MG PO TABS: 1.0000 mg | ORAL_TABLET | Freq: Every day | ORAL | Status: DC

## 2014-07-18 NOTE — Assessment & Plan Note (Signed)
Right breast DCIS status post lumpectomy 07/04/2014: Intermediate grade DCIS with necrosis and calcification spanning 0.6 cm, less than 0.1 cm to posterior and superior margins, ER 90%, PR 2%  Pathology review: I discussed the pathology report in detail and provided her with a copy of this report. Based on discussions with Dr. Donne Hazel, because of anatomical reasons additional margins cannot be obtained.  Recommendation: 1. Adjuvant radiation therapy followed by 2. Adjuvant anastrozole therapy 5 years  I provided her with a prescription for anastrozole that will be started 2 weeks after completion of radiation therapy. I would like to see her back one month after starting this therapy to assess tolerability to treatment.

## 2014-07-18 NOTE — Telephone Encounter (Signed)
s.w. pt and advised on sept appt

## 2014-07-18 NOTE — Progress Notes (Signed)
Patient Care Team: Rowe Clack, MD as PCP - General Rolm Bookbinder, MD (General Surgery) Dian Queen, MD (Obstetrics and Gynecology) Nicholas Lose, MD (Hematology and Oncology)  DIAGNOSIS: No matching staging information was found for the patient.  SUMMARY OF ONCOLOGIC HISTORY:   Neoplasm of right breast, primary tumor staging category Tis: ductal carcinoma in situ (DCIS)   04/16/2014 Mammogram  right breast calcifications 10 x 5 x 12 mm loose group of slightly coarse and slightly heterogeneous calcifications posterior upper right breast   05/17/2014 Initial Diagnosis  right breast DCIS with calcifications intermediate to high-grade , ER 98%, PR 2%   07/04/2014 Surgery right breast lumpectomy: DCIS intermediate grade with necrosis and calcifications spanning 0.6 cm, less than 0.1 cm to posterior and superior margin, ER 98%, PR 2%    CHIEF COMPLIANT: follow-up of recent right breast lumpectomy for DCIS  INTERVAL HISTORY: Rebekah Larson is a20 year old lady with above-mentioned history of right-sided DCIS underwent lumpectomy on 07/04/2014 and is here today for his surgical follow-up. She reports to be doing very well from the surgery. She has very minimal discomfort at this time. She had met with radiation oncology who plan to start radiation therapy towards the middle of May. There is a 6 week radiation treatment plan. Since she is done with surgery she is now able to understand and asked more questions regarding her treatment.  REVIEW OF SYSTEMS:   Constitutional: Denies fevers, chills or abnormal weight loss Eyes: Denies blurriness of vision Ears, nose, mouth, throat, and face: Denies mucositis or sore throat Respiratory: Denies cough, dyspnea or wheezes Cardiovascular: Denies palpitation, chest discomfort or lower extremity swelling Gastrointestinal:  Denies nausea, heartburn or change in bowel habits Skin: Denies abnormal skin rashes Lymphatics: Denies new lymphadenopathy  or easy bruising Neurological:Denies numbness, tingling or new weaknesses Behavioral/Psych: Mood is stable, no new changes  Breast: no pain or discomfort in the breast. All other systems were reviewed with the patient and are negative.  I have reviewed the past medical history, past surgical history, social history and family history with the patient and they are unchanged from previous note.  ALLERGIES:  has No Known Allergies.  MEDICATIONS:  Current Outpatient Prescriptions  Medication Sig Dispense Refill  . cholecalciferol (VITAMIN D) 1000 UNITS tablet Take 1,000 Units by mouth daily.    Marland Kitchen HYDROcodone-acetaminophen (NORCO/VICODIN) 5-325 MG per tablet     . non-metallic deodorant (ALRA) MISC Apply 1 application topically daily as needed.    Marland Kitchen anastrozole (ARIMIDEX) 1 MG tablet Take 1 tablet (1 mg total) by mouth daily. 30 tablet 0   No current facility-administered medications for this visit.    PHYSICAL EXAMINATION: ECOG PERFORMANCE STATUS: 0 - Asymptomatic  Filed Vitals:   07/18/14 1151  BP: 129/76  Pulse: 66  Temp: 97.9 F (36.6 C)  Resp: 18   Filed Weights   07/18/14 1151  Weight: 155 lb (70.308 kg)    GENERAL:alert, no distress and comfortable SKIN: skin color, texture, turgor are normal, no rashes or significant lesions EYES: normal, Conjunctiva are pink and non-injected, sclera clear OROPHARYNX:no exudate, no erythema and lips, buccal mucosa, and tongue normal  NECK: supple, thyroid normal size, non-tender, without nodularity LYMPH:  no palpable lymphadenopathy in the cervical, axillary or inguinal LUNGS: clear to auscultation and percussion with normal breathing effort HEART: regular rate & rhythm and no murmurs and no lower extremity edema ABDOMEN:abdomen soft, non-tender and normal bowel sounds Musculoskeletal:no cyanosis of digits and no clubbing  NEURO: alert &  oriented x 3 with fluent speech, no focal motor/sensory deficits LABORATORY DATA:  I have  reviewed the data as listed   Chemistry      Component Value Date/Time   NA 140 07/02/2014 1600   K 3.3* 07/02/2014 1600   CL 104 07/02/2014 1600   CO2 28 07/02/2014 1600   BUN 14 07/02/2014 1600   CREATININE 0.67 07/02/2014 1600      Component Value Date/Time   CALCIUM 9.6 07/02/2014 1600   ALKPHOS 67 12/23/2013 0718   AST 18 12/23/2013 0718   ALT 14 12/23/2013 0718   BILITOT 0.7 12/23/2013 0718       Lab Results  Component Value Date   WBC 6.8 07/02/2014   HGB 12.8 07/02/2014   HCT 37.6 07/02/2014   MCV 89.7 07/02/2014   PLT 205 07/02/2014   NEUTROABS 4.6 07/02/2014    ASSESSMENT & PLAN:  Neoplasm of right breast, primary tumor staging category Tis: ductal carcinoma in situ (DCIS) Right breast DCIS status post lumpectomy 07/04/2014: Intermediate grade DCIS with necrosis and calcification spanning 0.6 cm, less than 0.1 cm to posterior and superior margins, ER 90%, PR 2%  Pathology review: I discussed the pathology report in detail and provided her with a copy of this report. Based on discussions with Dr. Donne Hazel, because of anatomical reasons additional margins cannot be obtained.  Recommendation: 1. Adjuvant radiation therapy followed by 2. Adjuvant anastrozole therapy 5 years I discussed extensively regarding her prognosis from DCIS standpoint. The risk of recurrence of nothing further is done can be as high as 16-20%. With radiation therapy and antiestrogen therapy the risks 2-3%. Plan she will continue  I provided her with a prescription for anastrozole that will be started 2 weeks after completion of radiation therapy. I would like to see her back one month after starting this therapy to assess tolerability to treatment. Patient last was in [place] for 2 weeks in August. Instructed her to start anastrozole therapy after she returns from [place].  Return to clinic in September for follow-up  No orders of the defined types were placed in this encounter.    The patient has a good understanding of the overall plan. she agrees with it. She will call with any problems that may develop before her next visit here.   Rulon Eisenmenger, MD

## 2014-07-26 ENCOUNTER — Ambulatory Visit
Admission: RE | Admit: 2014-07-26 | Discharge: 2014-07-26 | Disposition: A | Discharge status: Home / Self Care | Payer: PRIVATE HEALTH INSURANCE | Source: Ambulatory Visit | Attending: Radiation Oncology | Admitting: Radiation Oncology

## 2014-07-26 ENCOUNTER — Encounter (INDEPENDENT_AMBULATORY_CARE_PROVIDER_SITE_OTHER): Payer: Self-pay

## 2014-07-26 DIAGNOSIS — D0511 Intraductal carcinoma in situ of right breast: Secondary | ICD-10-CM

## 2014-07-30 ENCOUNTER — Ambulatory Visit
Admission: RE | Admit: 2014-07-30 | Discharge: 2014-07-30 | Disposition: A | Discharge status: Home / Self Care | Payer: PRIVATE HEALTH INSURANCE | Source: Ambulatory Visit | Attending: Radiation Oncology | Admitting: Radiation Oncology

## 2014-07-30 DIAGNOSIS — D0511 Intraductal carcinoma in situ of right breast: Secondary | ICD-10-CM

## 2014-07-30 NOTE — Progress Notes (Signed)
Complex simulation/treatment planning note: The patient was taken to the CT simulator.  She was placed supine.  A Vac lock immobilization device was constructed on a custom breast board.  Radiopaque wires were placed along her medial, lateral, superior, and inferior borders for reference.  Her upper inner quadrant partial mastectomy scar was also marked.  She was then scanned.  An isocenter was chosen within the central portion of the breast.  The CT data set was sent to the planning system for contouring of her normal anatomy.  I contoured her tumor bed recognizing that her close margins included the superior and deep margins.  She was set up to medial and lateral right breast tangents.  One set of MLCs was designed to conform the field.  She is now ready for 3-D simulation planning.  I prescribing 4800 cGy in 24 sessions utilizing 6 MV photons.  This will be followed by electron beam boost for a further 1200 cGy in 6 sessions.  She will be boosted with 9 MV electrons based on the depth of her tumor bed.

## 2014-08-02 ENCOUNTER — Encounter: Payer: Self-pay | Admitting: Radiation Oncology

## 2014-08-02 DIAGNOSIS — D0511 Intraductal carcinoma in situ of right breast: Secondary | ICD-10-CM | POA: Diagnosis not present

## 2014-08-02 NOTE — Progress Notes (Signed)
3-D simulation note: The patient completed 3-D simulation for treatment to her right breast.  She was set up with 1 unique  [facility] for each tangent and also 1 unique electronic compensation fields for each tangent for a total of 4 complex treatment devices.  I prescribing 4800 cGy 24 sessions utilizing 6 MV photons.  Dose volume histograms were obtained for the target structures/tumor bed and also avoidance structures including the heart and lungs.  We met our departmental guidelines.

## 2014-08-05 ENCOUNTER — Ambulatory Visit: Payer: PRIVATE HEALTH INSURANCE | Admitting: Radiation Oncology

## 2014-08-05 ENCOUNTER — Ambulatory Visit
Admission: RE | Admit: 2014-08-05 | Discharge: 2014-08-05 | Disposition: A | Discharge status: Home / Self Care | Payer: PRIVATE HEALTH INSURANCE | Source: Ambulatory Visit | Attending: Radiation Oncology | Admitting: Radiation Oncology

## 2014-08-05 VITALS — BP 138/89 | HR 60 | Temp 97.7°F | Ht 68.0 in | Wt 153.5 lb

## 2014-08-05 DIAGNOSIS — D0511 Intraductal carcinoma in situ of right breast: Secondary | ICD-10-CM | POA: Diagnosis not present

## 2014-08-05 DIAGNOSIS — Z510 Encounter for antineoplastic radiation therapy: Secondary | ICD-10-CM | POA: Diagnosis not present

## 2014-08-05 MED ORDER — ALRA NON-METALLIC DEODORANT (RAD-ONC): 1.0000 "application " | Freq: Once | TOPICAL | Status: DC

## 2014-08-05 MED ORDER — RADIAPLEXRX EX GEL
Freq: Once | CUTANEOUS | Status: AC
  Administered 2014-08-05: 19:00:00 via TOPICAL

## 2014-08-05 MED ORDER — ALRA NON-METALLIC DEODORANT (RAD-ONC)
1.0000 "application " | Freq: Once | TOPICAL | Status: AC
  Administered 2014-08-05: 1 via TOPICAL

## 2014-08-05 MED ORDER — RADIAPLEXRX EX GEL: Freq: Once | CUTANEOUS | Status: DC

## 2014-08-05 NOTE — Progress Notes (Addendum)
Port and Treat today. No voiced concerns today.  Pt here for patient teaching.  Pt given Radiation and You booklet, skin care instructions, Alra deodorant and Radiaplex gel. Reviewed areas of pertinence such as  . Pt able to give teach back of to pat skin, use unscented/gentle soap, use baby wipes, have Imodium on hand, drink plenty of water and sitz bath,apply Radiaplex bid, avoid applying anything to skin within 4 hours of treatment, avoid wearing an under wire bra and to use an electric razor if they must shave. Pt demonstrated understanding of information given and will contact nursing with any questions or concerns.

## 2014-08-05 NOTE — Progress Notes (Signed)
Weekly Management Note:  Site: Right breast Current Dose:  200  cGy Projected Dose: 4800  cGy followed by 6 fraction boost  Narrative: The patient is seen today for routine under treatment assessment. CBCT/MVCT images/port films were reviewed. The chart was reviewed.   She begins her radiation therapy today.  She is without complaints.  She had patient education today.  Physical Examination: There were no vitals filed for this visit..  Weight:  .  Not examined today.  Impression: Tolerating radiation therapy well.  Plan: Continue radiation therapy as planned.

## 2014-08-05 NOTE — Addendum Note (Signed)
Encounter addended by: Benn Moulder, RN on: 08/05/2014  6:52 PM<BR>     Documentation filed: Dx Association, Medications, Inpatient MAR, Orders

## 2014-08-06 ENCOUNTER — Ambulatory Visit
Admission: RE | Admit: 2014-08-06 | Discharge: 2014-08-06 | Disposition: A | Discharge status: Home / Self Care | Payer: PRIVATE HEALTH INSURANCE | Source: Ambulatory Visit | Attending: Radiation Oncology | Admitting: Radiation Oncology

## 2014-08-06 DIAGNOSIS — D0511 Intraductal carcinoma in situ of right breast: Secondary | ICD-10-CM | POA: Diagnosis not present

## 2014-08-07 ENCOUNTER — Ambulatory Visit
Admission: RE | Admit: 2014-08-07 | Discharge: 2014-08-07 | Disposition: A | Discharge status: Home / Self Care | Payer: PRIVATE HEALTH INSURANCE | Source: Ambulatory Visit | Attending: Radiation Oncology | Admitting: Radiation Oncology

## 2014-08-07 DIAGNOSIS — D0511 Intraductal carcinoma in situ of right breast: Secondary | ICD-10-CM | POA: Diagnosis not present

## 2014-08-08 ENCOUNTER — Ambulatory Visit
Admission: RE | Admit: 2014-08-08 | Discharge: 2014-08-08 | Disposition: A | Discharge status: Home / Self Care | Payer: PRIVATE HEALTH INSURANCE | Source: Ambulatory Visit | Attending: Radiation Oncology | Admitting: Radiation Oncology

## 2014-08-08 DIAGNOSIS — D0511 Intraductal carcinoma in situ of right breast: Secondary | ICD-10-CM | POA: Diagnosis not present

## 2014-08-09 ENCOUNTER — Ambulatory Visit
Admission: RE | Admit: 2014-08-09 | Discharge: 2014-08-09 | Disposition: A | Discharge status: Home / Self Care | Payer: PRIVATE HEALTH INSURANCE | Source: Ambulatory Visit | Attending: Radiation Oncology | Admitting: Radiation Oncology

## 2014-08-09 DIAGNOSIS — D0511 Intraductal carcinoma in situ of right breast: Secondary | ICD-10-CM | POA: Diagnosis not present

## 2014-08-12 ENCOUNTER — Ambulatory Visit
Admission: RE | Admit: 2014-08-12 | Discharge: 2014-08-12 | Disposition: A | Discharge status: Home / Self Care | Payer: PRIVATE HEALTH INSURANCE | Source: Ambulatory Visit | Attending: Radiation Oncology | Admitting: Radiation Oncology

## 2014-08-12 ENCOUNTER — Encounter: Payer: Self-pay | Admitting: Radiation Oncology

## 2014-08-12 VITALS — BP 128/84 | HR 69 | Temp 98.6°F | Ht 68.0 in | Wt 154.3 lb

## 2014-08-12 DIAGNOSIS — D0511 Intraductal carcinoma in situ of right breast: Secondary | ICD-10-CM | POA: Diagnosis not present

## 2014-08-12 NOTE — Progress Notes (Signed)
Weekly Management Note:  Site: Right breast Current Dose:  1200  cGy Projected Dose: 4800  cGy followed by 6 fraction boost  Narrative: The patient is seen today for routine under treatment assessment. CBCT/MVCT images/port films were reviewed. The chart was reviewed.   She is without complaints today.  She denies any skin changes.  She uses Radioplex gel.  Physical Examination:  Filed Vitals:   08/12/14 1541  BP: 128/84  Pulse: 69  Temp: 98.6 F (37 C)  .  Weight: 154 lb 4.8 oz (69.99 kg).  She is not examined today.  Impression: Tolerating radiation therapy well.  Plan: Continue radiation therapy as planned.

## 2014-08-12 NOTE — Progress Notes (Signed)
Rebekah Larson has no voiced concerns today.

## 2014-08-13 ENCOUNTER — Ambulatory Visit
Admission: RE | Admit: 2014-08-13 | Discharge: 2014-08-13 | Disposition: A | Discharge status: Home / Self Care | Payer: PRIVATE HEALTH INSURANCE | Source: Ambulatory Visit | Attending: Radiation Oncology | Admitting: Radiation Oncology

## 2014-08-13 DIAGNOSIS — D0511 Intraductal carcinoma in situ of right breast: Secondary | ICD-10-CM | POA: Diagnosis not present

## 2014-08-14 ENCOUNTER — Ambulatory Visit
Admission: RE | Admit: 2014-08-14 | Discharge: 2014-08-14 | Disposition: A | Discharge status: Home / Self Care | Payer: PRIVATE HEALTH INSURANCE | Source: Ambulatory Visit | Attending: Radiation Oncology | Admitting: Radiation Oncology

## 2014-08-14 DIAGNOSIS — D0511 Intraductal carcinoma in situ of right breast: Secondary | ICD-10-CM | POA: Diagnosis not present

## 2014-08-15 ENCOUNTER — Ambulatory Visit
Admission: RE | Admit: 2014-08-15 | Discharge: 2014-08-15 | Disposition: A | Discharge status: Home / Self Care | Payer: PRIVATE HEALTH INSURANCE | Source: Ambulatory Visit | Attending: Radiation Oncology | Admitting: Radiation Oncology

## 2014-08-15 DIAGNOSIS — D0511 Intraductal carcinoma in situ of right breast: Secondary | ICD-10-CM | POA: Diagnosis not present

## 2014-08-16 ENCOUNTER — Ambulatory Visit
Admission: RE | Admit: 2014-08-16 | Discharge: 2014-08-16 | Disposition: A | Discharge status: Home / Self Care | Payer: PRIVATE HEALTH INSURANCE | Source: Ambulatory Visit | Attending: Radiation Oncology | Admitting: Radiation Oncology

## 2014-08-16 DIAGNOSIS — D0511 Intraductal carcinoma in situ of right breast: Secondary | ICD-10-CM

## 2014-08-16 NOTE — Progress Notes (Signed)
Weekly Management Note:   ICD-9-CM ICD-10-CM   1. Neoplasm of right breast, primary tumor staging category Tis: ductal carcinoma in situ (DCIS) 233.0 D05.11     Site: Right breast Current Dose:  2000 cGy Projected Dose: 4800  cGy followed by 6 fraction boost  Narrative: The patient is seen today for routine under treatment assessment. CBCT/MVCT images/port films were reviewed. The chart was reviewed.  She asked to be seen due to a suture sticking out of her lumpectomy scar.   Physical Examination:  There were no vitals filed for this visit..  Weight:  .   a suture sticking out of her lumpectomy scar without sign of infection. This was trimmed with a sterile kit, without complication, at the patient's request. The suture was not pulled out, in order to avoid trauma to scar/tissue.   Impression: Tolerating radiation therapy well.  Plan: Continue radiation therapy as planned. Apply neosporin to scar. -----------------------------------  Eppie Gibson, MD

## 2014-08-17 NOTE — Addendum Note (Signed)
Encounter addended by: Benn Moulder, RN on: 08/17/2014 10:54 AM<BR>     Documentation filed: Demographics Visit

## 2014-08-20 ENCOUNTER — Encounter: Payer: Self-pay | Admitting: Radiation Oncology

## 2014-08-20 ENCOUNTER — Ambulatory Visit
Admission: RE | Admit: 2014-08-20 | Discharge: 2014-08-20 | Disposition: A | Discharge status: Home / Self Care | Payer: PRIVATE HEALTH INSURANCE | Source: Ambulatory Visit | Attending: Radiation Oncology | Admitting: Radiation Oncology

## 2014-08-20 ENCOUNTER — Encounter: Payer: Self-pay | Admitting: *Deleted

## 2014-08-20 VITALS — BP 131/90 | HR 66 | Temp 97.7°F | Ht 68.0 in | Wt 152.5 lb

## 2014-08-20 DIAGNOSIS — D0511 Intraductal carcinoma in situ of right breast: Secondary | ICD-10-CM | POA: Diagnosis not present

## 2014-08-20 NOTE — Progress Notes (Signed)
Met with pt during wkly check with Dr. Valere Dross. Relate she is doing well and without complaints. Encourage pt to call with needs or concerns. Received verbal understanding.

## 2014-08-20 NOTE — Progress Notes (Signed)
Rebekah Larson has received 11 fractions to her right breast.  Note mild erythema of the tx field with her report of mild itching.  No rash note.  No fatigue.

## 2014-08-20 NOTE — Progress Notes (Signed)
Weekly Management Note:  Site: Right breast Current Dose:  2200  cGy Projected Dose: 4800  cGy  Narrative: The patient is seen today for routine under treatment assessment. CBCT/MVCT images/port films were reviewed. The chart was reviewed.   She is without complaints today.  A small suture protruding into the skin was clipped last Friday by Dr. Isidore Moos.  Physical Examination:  Filed Vitals:   08/20/14 1641  BP: 131/90  Pulse: 66  Temp: 97.7 F (36.5 C)  .  Weight: 152 lb 8 oz (69.174 kg).  There is mild erythema the scan and there is no residual suture palpable or visible.  Impression: Tolerating radiation therapy well.  Plan: Continue radiation therapy as planned.

## 2014-08-21 ENCOUNTER — Ambulatory Visit
Admission: RE | Admit: 2014-08-21 | Discharge: 2014-08-21 | Disposition: A | Discharge status: Home / Self Care | Payer: PRIVATE HEALTH INSURANCE | Source: Ambulatory Visit | Attending: Radiation Oncology | Admitting: Radiation Oncology

## 2014-08-21 DIAGNOSIS — D0511 Intraductal carcinoma in situ of right breast: Secondary | ICD-10-CM | POA: Diagnosis not present

## 2014-08-22 ENCOUNTER — Ambulatory Visit
Admission: RE | Admit: 2014-08-22 | Discharge: 2014-08-22 | Disposition: A | Discharge status: Home / Self Care | Payer: PRIVATE HEALTH INSURANCE | Source: Ambulatory Visit | Attending: Radiation Oncology | Admitting: Radiation Oncology

## 2014-08-22 DIAGNOSIS — D0511 Intraductal carcinoma in situ of right breast: Secondary | ICD-10-CM | POA: Diagnosis not present

## 2014-08-23 ENCOUNTER — Ambulatory Visit
Admission: RE | Admit: 2014-08-23 | Discharge: 2014-08-23 | Disposition: A | Discharge status: Home / Self Care | Payer: PRIVATE HEALTH INSURANCE | Source: Ambulatory Visit | Attending: Radiation Oncology | Admitting: Radiation Oncology

## 2014-08-23 DIAGNOSIS — D0511 Intraductal carcinoma in situ of right breast: Secondary | ICD-10-CM | POA: Diagnosis not present

## 2014-08-26 ENCOUNTER — Encounter: Payer: Self-pay | Admitting: Radiation Oncology

## 2014-08-26 ENCOUNTER — Ambulatory Visit
Admission: RE | Admit: 2014-08-26 | Discharge: 2014-08-26 | Disposition: A | Discharge status: Home / Self Care | Payer: PRIVATE HEALTH INSURANCE | Source: Ambulatory Visit | Attending: Radiation Oncology | Admitting: Radiation Oncology

## 2014-08-26 DIAGNOSIS — D0511 Intraductal carcinoma in situ of right breast: Secondary | ICD-10-CM

## 2014-08-26 NOTE — Progress Notes (Signed)
Weekly Management Note:  Site: Right breast Current Dose:  3000  cGy Projected Dose: 6000  cGy  Narrative: The patient is seen today for routine under treatment assessment. CBCT/MVCT images/port films were reviewed. The chart was reviewed.   She is without complaints today.  Physical Examination: There were no vitals filed for this visit..  Weight:  .  There is mild erythema of the skin within the treatment field without areas of desquamation.  Impression: Tolerating radiation therapy well.  Plan: Continue radiation therapy as planned.

## 2014-08-26 NOTE — Progress Notes (Signed)
Electron beam simulation note: The patient completed virtual simulation for her right breast electron beam boost.  She is set up RAO, en face to her tumor bed.  One custom block is constructed to conform the field.  I am prescribing 1200 cGy in 6 sessions utilizing 9 MEV electrons.  The dose is prescribed at the 97% isodose curve.  Care was taken to review her dosimetry along the chest wall/deep margin and also along her superior margin where she had close margins.  A Monte Carlo calculation was performed.

## 2014-08-27 ENCOUNTER — Ambulatory Visit
Admission: RE | Admit: 2014-08-27 | Discharge: 2014-08-27 | Disposition: A | Discharge status: Home / Self Care | Payer: PRIVATE HEALTH INSURANCE | Source: Ambulatory Visit | Attending: Radiation Oncology | Admitting: Radiation Oncology

## 2014-08-27 DIAGNOSIS — D0511 Intraductal carcinoma in situ of right breast: Secondary | ICD-10-CM | POA: Diagnosis not present

## 2014-08-28 ENCOUNTER — Ambulatory Visit
Admission: RE | Admit: 2014-08-28 | Discharge: 2014-08-28 | Disposition: A | Discharge status: Home / Self Care | Payer: PRIVATE HEALTH INSURANCE | Source: Ambulatory Visit | Attending: Radiation Oncology | Admitting: Radiation Oncology

## 2014-08-28 DIAGNOSIS — D0511 Intraductal carcinoma in situ of right breast: Secondary | ICD-10-CM | POA: Diagnosis not present

## 2014-08-29 ENCOUNTER — Ambulatory Visit
Admission: RE | Admit: 2014-08-29 | Discharge: 2014-08-29 | Disposition: A | Discharge status: Home / Self Care | Payer: PRIVATE HEALTH INSURANCE | Source: Ambulatory Visit | Attending: Radiation Oncology | Admitting: Radiation Oncology

## 2014-08-29 DIAGNOSIS — D0511 Intraductal carcinoma in situ of right breast: Secondary | ICD-10-CM | POA: Diagnosis not present

## 2014-08-30 ENCOUNTER — Ambulatory Visit
Admission: RE | Admit: 2014-08-30 | Discharge: 2014-08-30 | Disposition: A | Discharge status: Home / Self Care | Payer: PRIVATE HEALTH INSURANCE | Source: Ambulatory Visit | Attending: Radiation Oncology | Admitting: Radiation Oncology

## 2014-08-30 DIAGNOSIS — D0511 Intraductal carcinoma in situ of right breast: Secondary | ICD-10-CM | POA: Diagnosis not present

## 2014-09-02 ENCOUNTER — Ambulatory Visit
Admission: RE | Admit: 2014-09-02 | Discharge: 2014-09-02 | Disposition: A | Discharge status: Home / Self Care | Payer: PRIVATE HEALTH INSURANCE | Source: Ambulatory Visit | Attending: Radiation Oncology | Admitting: Radiation Oncology

## 2014-09-02 ENCOUNTER — Ambulatory Visit: Payer: PRIVATE HEALTH INSURANCE | Admitting: Radiation Oncology

## 2014-09-02 DIAGNOSIS — D0511 Intraductal carcinoma in situ of right breast: Secondary | ICD-10-CM

## 2014-09-02 NOTE — Progress Notes (Addendum)
Ms. Selk has received 20 fractions to her right breast.  Note erythema and mild rash-like apperance of the upper, inner portion of her right breast and the inframmary fold.

## 2014-09-02 NOTE — Progress Notes (Signed)
   Weekly Management Note:  Outpatient    ICD-9-CM ICD-10-CM   1. Neoplasm of right breast, primary tumor staging category Tis: ductal carcinoma in situ (DCIS) 233.0 D05.11     Current Dose:  40 Gy  Projected Dose: 60 Gy   Narrative:  The patient presents for routine under treatment assessment.  CBCT/MVCT images/Port film x-rays were reviewed.  The chart was checked. She notes erythema and mild rash-like apperance of the upper, inner portion of her right breast and the inframmary fold. The rash produces no discomfort. She has experienced some itching.   Physical Findings:  vitals were not taken for this visit.  Wt Readings from Last 3 Encounters:  09/02/14 154 lb 6.4 oz (70.035 kg)  08/20/14 152 lb 8 oz (69.174 kg)  08/12/14 154 lb 4.8 oz (69.99 kg)   Erythema of the right breast with patchy dermatitis in the upper inner quadrant.  Impression:  The patient is tolerating radiotherapy.  Plan:  Continue radiotherapy as planned.   This document serves as a record of services personally performed by Eppie Gibson, MD. It was created on her behalf by Arlyce Harman, a trained medical scribe. The creation of this record is based on the scribe's personal observations and the provider's statements to them. This document has been checked and approved by the attending provider. ________________________________   Eppie Gibson, M.D.

## 2014-09-03 ENCOUNTER — Ambulatory Visit
Admission: RE | Admit: 2014-09-03 | Discharge: 2014-09-03 | Disposition: A | Discharge status: Home / Self Care | Payer: PRIVATE HEALTH INSURANCE | Source: Ambulatory Visit | Attending: Radiation Oncology | Admitting: Radiation Oncology

## 2014-09-03 DIAGNOSIS — D0511 Intraductal carcinoma in situ of right breast: Secondary | ICD-10-CM | POA: Diagnosis not present

## 2014-09-03 NOTE — Addendum Note (Signed)
Encounter addended by: Benn Moulder, RN on: 09/03/2014 12:37 PM<BR>     Documentation filed: Vitals Section

## 2014-09-04 ENCOUNTER — Ambulatory Visit
Admission: RE | Admit: 2014-09-04 | Discharge: 2014-09-04 | Disposition: A | Discharge status: Home / Self Care | Payer: PRIVATE HEALTH INSURANCE | Source: Ambulatory Visit | Attending: Radiation Oncology | Admitting: Radiation Oncology

## 2014-09-04 DIAGNOSIS — D0511 Intraductal carcinoma in situ of right breast: Secondary | ICD-10-CM | POA: Diagnosis not present

## 2014-09-05 ENCOUNTER — Ambulatory Visit
Admission: RE | Admit: 2014-09-05 | Discharge: 2014-09-05 | Disposition: A | Discharge status: Home / Self Care | Payer: PRIVATE HEALTH INSURANCE | Source: Ambulatory Visit | Attending: Radiation Oncology | Admitting: Radiation Oncology

## 2014-09-05 DIAGNOSIS — D0511 Intraductal carcinoma in situ of right breast: Secondary | ICD-10-CM | POA: Diagnosis not present

## 2014-09-06 ENCOUNTER — Ambulatory Visit: Payer: PRIVATE HEALTH INSURANCE

## 2014-09-06 ENCOUNTER — Ambulatory Visit
Admission: RE | Admit: 2014-09-06 | Discharge: 2014-09-06 | Disposition: A | Discharge status: Home / Self Care | Payer: PRIVATE HEALTH INSURANCE | Source: Ambulatory Visit | Attending: Radiation Oncology | Admitting: Radiation Oncology

## 2014-09-09 ENCOUNTER — Encounter: Payer: Self-pay | Admitting: Radiation Oncology

## 2014-09-09 ENCOUNTER — Ambulatory Visit
Admission: RE | Admit: 2014-09-09 | Discharge: 2014-09-09 | Disposition: A | Discharge status: Home / Self Care | Payer: PRIVATE HEALTH INSURANCE | Source: Ambulatory Visit | Attending: Radiation Oncology | Admitting: Radiation Oncology

## 2014-09-09 VITALS — BP 120/80 | HR 65 | Temp 98.1°F | Resp 12 | Wt 154.4 lb

## 2014-09-09 DIAGNOSIS — D0511 Intraductal carcinoma in situ of right breast: Secondary | ICD-10-CM | POA: Diagnosis not present

## 2014-09-09 NOTE — Progress Notes (Signed)
PAIN: She rates her pain as a 3 on a scale of 0-10. intermittent and burning over breast. SKIN: Pt right breast- positive for Hyperpigmentation, Pruritus, erythema and breast tenderness.  Pt denies edema.  Pt continues to apply Biafine as directed.  BP 120/80 mmHg  Pulse 65  Temp(Src) 98.1 F (36.7 C) (Oral)  Resp 12  Wt 154 lb 6.4 oz (70.035 kg)  SpO2 99%  LMP 07/10/2009

## 2014-09-09 NOTE — Progress Notes (Signed)
Weekly Management Note:  Site: Right breast boost Current Dose:  200  cGy Projected Dose: 1200  cGy  Narrative: The patient is seen today for routine under treatment assessment. CBCT/MVCT images/port films were reviewed. The chart was reviewed.   She is gel doing well although she does have mild pruritus along the breast.  She also has mild to moderate right breast discomfort as expected.  She uses Biafine cream.  Physical Examination:  Filed Vitals:   09/09/14 1616  BP: 120/80  Pulse: 65  Temp: 98.1 F (36.7 C)  Resp: 12  .  Weight: 154 lb 6.4 oz (70.035 kg).  There is patchy radiation dermatitis with a few areas of dry desquamation.  There is involvement of the lower axilla as well.  Impression: Tolerating radiation therapy well.  Plan: Continue radiation therapy as planned.

## 2014-09-10 ENCOUNTER — Ambulatory Visit
Admission: RE | Admit: 2014-09-10 | Discharge: 2014-09-10 | Disposition: A | Discharge status: Home / Self Care | Payer: PRIVATE HEALTH INSURANCE | Source: Ambulatory Visit | Attending: Radiation Oncology | Admitting: Radiation Oncology

## 2014-09-10 DIAGNOSIS — D0511 Intraductal carcinoma in situ of right breast: Secondary | ICD-10-CM | POA: Diagnosis not present

## 2014-09-11 ENCOUNTER — Ambulatory Visit
Admission: RE | Admit: 2014-09-11 | Discharge: 2014-09-11 | Disposition: A | Discharge status: Home / Self Care | Payer: PRIVATE HEALTH INSURANCE | Source: Ambulatory Visit | Attending: Radiation Oncology | Admitting: Radiation Oncology

## 2014-09-11 DIAGNOSIS — D0511 Intraductal carcinoma in situ of right breast: Secondary | ICD-10-CM | POA: Diagnosis not present

## 2014-09-12 ENCOUNTER — Ambulatory Visit
Admission: RE | Admit: 2014-09-12 | Discharge: 2014-09-12 | Disposition: A | Discharge status: Home / Self Care | Payer: PRIVATE HEALTH INSURANCE | Source: Ambulatory Visit | Attending: Radiation Oncology | Admitting: Radiation Oncology

## 2014-09-12 DIAGNOSIS — D0511 Intraductal carcinoma in situ of right breast: Secondary | ICD-10-CM | POA: Diagnosis not present

## 2014-09-13 ENCOUNTER — Ambulatory Visit
Admission: RE | Admit: 2014-09-13 | Discharge: 2014-09-13 | Disposition: A | Discharge status: Home / Self Care | Payer: PRIVATE HEALTH INSURANCE | Source: Ambulatory Visit | Attending: Radiation Oncology | Admitting: Radiation Oncology

## 2014-09-13 ENCOUNTER — Ambulatory Visit: Payer: PRIVATE HEALTH INSURANCE

## 2014-09-13 DIAGNOSIS — D0511 Intraductal carcinoma in situ of right breast: Secondary | ICD-10-CM | POA: Diagnosis not present

## 2014-09-16 ENCOUNTER — Encounter: Payer: Self-pay | Admitting: Radiation Oncology

## 2014-09-16 ENCOUNTER — Ambulatory Visit
Admission: RE | Admit: 2014-09-16 | Discharge: 2014-09-16 | Disposition: A | Discharge status: Home / Self Care | Payer: PRIVATE HEALTH INSURANCE | Source: Ambulatory Visit | Attending: Radiation Oncology | Admitting: Radiation Oncology

## 2014-09-16 VITALS — BP 123/90 | HR 65 | Temp 98.0°F | Ht 68.0 in | Wt 153.5 lb

## 2014-09-16 DIAGNOSIS — D0511 Intraductal carcinoma in situ of right breast: Secondary | ICD-10-CM

## 2014-09-16 MED ORDER — BIAFINE EX EMUL
CUTANEOUS | Status: DC | PRN
  Administered 2014-09-16: 17:00:00 via TOPICAL

## 2014-09-16 NOTE — Progress Notes (Signed)
Weekly Management Note:  Site: Right breast boost Current Dose:  1200  cGy Projected Dose: 1200  cGy  Narrative: The patient is seen today for routine under treatment assessment. CBCT/MVCT images/port films were reviewed. The chart was reviewed.   She does report discomfort along her right axilla which she has desquamation.  She uses Biafine cream.  Physical Examination:  Filed Vitals:   09/16/14 1628  BP: 123/90  Pulse: 65  Temp: 98 F (36.7 C)  .  Weight: 153 lb 8 oz (69.627 kg).  There is erythema along the right breast with dry desquamation along the lower axilla.  The erythema is more intense along the upper inner quadrant along her tumor bed/electron beam field.  Impression: Radiation therapy well tolerated.  Her treatment is complete.  Plan: Follow-up visit in 4-6 weeks.

## 2014-09-16 NOTE — Progress Notes (Signed)
[facility] End of Treatment Note  Name:Rebekah Larson  Date: 09/16/2014 ION:[id number] DOB:Jun 11, 1965   Status:outpatient    CC: Gwendolyn Grant, MD  Dr. Rolm Bookbinder, Dr. Nicholas Lose  REFERRING PHYSICIAN: Dr. Rolm Bookbinder    DIAGNOSIS: Stage 0 (Tis N0 M0) DCIS of the right breast   INDICATION FOR TREATMENT: Curative   TREATMENT DATES: 08/05/2014 through 09/16/2014                          SITE/DOSE:  Right breast 4800 cGy in 24 sessions, right breast tumor bed boost 1200 cGy in 6 sessions, for a cumulative tumor bed dose of 6000 cGy in 30 sessions                      BEAMS/ENERGY: Tangential fields to the right breast with 6 MV photons.  9 MEV electrons, right breast boost, delivered en face                  NARRATIVE:  [name] tolerated treatment well with the expected degree of radiation dermatitis/erythema by completion of therapy.  She was most symptomatic from a focal area of dry desquamation along her lower axilla at the superior edge of her tangent field.  She used Biafine cream during her course of therapy.                          PLAN: Routine followup in one month. Patient instructed to call if questions or worsening complaints in interim.  She will now discuss antiestrogen therapy with Dr. Lindi Adie.

## 2014-09-30 ENCOUNTER — Telehealth: Payer: Self-pay | Admitting: Adult Health

## 2014-09-30 NOTE — Telephone Encounter (Signed)
I called Ms. Buntrock to attempt to schedule her Survivorship Clinic appt. I'd like to coordinate her survivorship visit with her routine 62-month follow-up appt with Dr. Valere Dross on 11/06/14.  I left my direct office number in the voicemail and asked her to return my call so that we can get her survivorship visit scheduled. I look forward to participating in her care.   Mike Craze, NP [place] [phone]

## 2014-10-03 ENCOUNTER — Encounter: Payer: Self-pay | Admitting: *Deleted

## 2014-10-03 NOTE — Progress Notes (Signed)
Called pt to assess needs post xrt and congratulate on completion of xrt treatment. Relate doing well and without complaints. Request her appt with Dr. Valere Dross be r/s to later in the week or a later time on 11/06/14. Request sent to Dr. Charlton Amor scheduler. Encourage pt to call with questions or needs. Received verbal understanding.

## 2014-10-31 ENCOUNTER — Ambulatory Visit: Payer: PRIVATE HEALTH INSURANCE | Admitting: Hematology and Oncology

## 2014-11-06 ENCOUNTER — Ambulatory Visit: Payer: Self-pay | Admitting: Radiation Oncology

## 2014-11-12 ENCOUNTER — Telehealth: Payer: Self-pay | Admitting: Adult Health

## 2014-11-12 NOTE — Telephone Encounter (Signed)
I left Rebekah Larson a voicemail asking her to return my call in order to schedule her survivorship visit now that she has completed treatment for breast cancer.  We look forward to participating in her care.   Mike Craze, NP [place] [phone]

## 2014-11-14 ENCOUNTER — Encounter: Payer: Self-pay | Admitting: Radiation Oncology

## 2014-11-19 ENCOUNTER — Other Ambulatory Visit: Payer: Self-pay | Admitting: Adult Health

## 2014-11-19 ENCOUNTER — Ambulatory Visit
Admission: RE | Admit: 2014-11-19 | Discharge: 2014-11-19 | Disposition: A | Discharge status: Home / Self Care | Payer: PRIVATE HEALTH INSURANCE | Source: Ambulatory Visit | Attending: Radiation Oncology | Admitting: Radiation Oncology

## 2014-11-19 ENCOUNTER — Encounter: Payer: Self-pay | Admitting: Radiation Oncology

## 2014-11-19 VITALS — BP 124/92 | HR 70 | Temp 98.2°F | Ht 68.0 in | Wt 155.0 lb

## 2014-11-19 DIAGNOSIS — D0511 Intraductal carcinoma in situ of right breast: Secondary | ICD-10-CM

## 2014-11-19 HISTORY — DX: Personal history of irradiation: Z92.3

## 2014-11-19 NOTE — Progress Notes (Signed)
Rebekah Larson reports that she does not have any pain, but has mild darkening of her areola and denies any fatigue.

## 2014-11-19 NOTE — Progress Notes (Signed)
CC: Dr. Gwendolyn Grant, Dr. Rolm Bookbinder, Dr. Nicholas Lose, Mike Craze NP (survivorship)  Follow-up note:  Rebekah Larson visits today approximately 2 months following completion of radiation therapy following conservative surgery in the management of her DCIS of the right breast.  She is without complaints today.  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  She is pleased with her cosmesis.  She is getting ready to start anastrozole under the direction of Dr. Lindi Adie.  She did visit [place] to discuss all options regarding antiestrogen therapy for "peace of mind".  She feels comfortable with all opinions.  She was scheduled see Dr. Lindi Adie on September 19, but she will postpone this until late September or early October when she has been on anastrozole for one month.  She also received a call from Ancil Boozer NP regarding survivorship.  Physical examination: Alert and oriented. Filed Vitals:   11/19/14 1522  BP: 124/92  Pulse: 70  Temp: 98.2 F (36.8 C)   Nodes: There is no palpable axillary lymphadenopathy.  Breasts: There is faint residual hyperpigmentation of the skin along the lower right axilla but otherwise there is no residual radiation dermatitis or hyperpigmentation.  Cosmesis is excellent.  No masses are appreciated.  Extremities: Without edema.  Impression: Satisfactory progress.  She'll begin anastrozole in the near future and then see Dr. Lindi Adie for a one-month follow-up visit.  Her initial mammography was in January 2016 although she did have a right mammogram in early May at the [facility] prior to initiation of radiation therapy to rule out residual microcalcifications.  She will be due for a screening mammogram on the left in January.  I assume Dr. Lindi Adie will make sure that she receives her mammography at appropriate intervals.  Plan: As above.   She'll meet with Elzie Rings from survivorship this fall.  I've not scheduled Rebekah Larson for a formal follow-up visit knowing that she'll be followed by Dr. Lindi Adie.

## 2014-11-22 ENCOUNTER — Telehealth: Payer: Self-pay | Admitting: Nurse Practitioner

## 2014-11-22 NOTE — Telephone Encounter (Signed)
Called patient and she is aware of her survivorship appointment

## 2014-12-05 ENCOUNTER — Encounter: Payer: PRIVATE HEALTH INSURANCE | Admitting: Internal Medicine

## 2014-12-09 ENCOUNTER — Ambulatory Visit: Payer: PRIVATE HEALTH INSURANCE | Admitting: Hematology and Oncology

## 2014-12-24 ENCOUNTER — Ambulatory Visit
Admission: RE | Admit: 2014-12-24 | Payer: PRIVATE HEALTH INSURANCE | Source: Ambulatory Visit | Admitting: Radiation Oncology

## 2014-12-30 NOTE — Assessment & Plan Note (Signed)
Right breast DCIS status post lumpectomy 07/04/2014: Intermediate grade DCIS with necrosis and calcification spanning 0.6 cm, less than 0.1 cm to posterior and superior margins, ER 90%, PR 2% S/P XRT 09/16/14; started anastrozole 1 mg daily Aug 2016  Anastrozole Toxicities:  RTC in 6 months

## 2014-12-31 ENCOUNTER — Ambulatory Visit (HOSPITAL_BASED_OUTPATIENT_CLINIC_OR_DEPARTMENT_OTHER): Payer: PRIVATE HEALTH INSURANCE | Admitting: Hematology and Oncology

## 2014-12-31 ENCOUNTER — Encounter: Payer: Self-pay | Admitting: Hematology and Oncology

## 2014-12-31 ENCOUNTER — Telehealth: Payer: Self-pay | Admitting: Hematology and Oncology

## 2014-12-31 VITALS — BP 124/82 | HR 72 | Temp 98.0°F | Resp 20 | Ht 68.0 in | Wt 156.0 lb

## 2014-12-31 DIAGNOSIS — D0511 Intraductal carcinoma in situ of right breast: Secondary | ICD-10-CM

## 2014-12-31 DIAGNOSIS — Z170 Estrogen receptor positive status [ER+]: Secondary | ICD-10-CM

## 2014-12-31 DIAGNOSIS — Z79811 Long term (current) use of aromatase inhibitors: Secondary | ICD-10-CM

## 2014-12-31 NOTE — Addendum Note (Signed)
Addended by: Prentiss Bells on: 12/31/2014 05:18 PM   Modules accepted: Medications

## 2014-12-31 NOTE — Progress Notes (Signed)
Patient Care Team: Rowe Clack, MD as PCP - General Rolm Bookbinder, MD (General Surgery) Dian Queen, MD (Obstetrics and Gynecology) Nicholas Lose, MD (Hematology and Oncology)  DIAGNOSIS: No matching staging information was found for the patient.  SUMMARY OF ONCOLOGIC HISTORY:   Neoplasm of right breast, primary tumor staging category Tis: ductal carcinoma in situ (DCIS)   04/16/2014 Mammogram  right breast calcifications 10 x 5 x 12 mm loose group of slightly coarse and slightly heterogeneous calcifications posterior upper right breast   05/17/2014 Initial Diagnosis  right breast DCIS with calcifications intermediate to high-grade , ER 98%, PR 2%   07/04/2014 Surgery right breast lumpectomy: DCIS intermediate grade with necrosis and calcifications spanning 0.6 cm, less than 0.1 cm to posterior and superior margin, ER 98%, PR 2%   08/05/2014 - 09/16/2014 Radiation Therapy Right breast 4800 cGy in 24 sessions, right breast tumor bed boost 1200 cGy in 6 sessions, for a cumulative tumor bed dose of 6000 cGy in 30 sessions      12/02/2014 -  Anti-estrogen oral therapy Anastrozole 1 mg daily    CHIEF COMPLIANT: Toxicity check on anastrozole  INTERVAL HISTORY: Rebekah Larson is a 49 year old with above-mentioned history of right-sided DCIS treated with lumpectomy and adjuvant radiation and has started on anastrozole therapy. She is tolerating anastrozole extremely well without any major problems concerns. She denies any hot flashes or myalgias.  REVIEW OF SYSTEMS:   Constitutional: Denies fevers, chills or abnormal weight loss Eyes: Denies blurriness of vision Ears, nose, mouth, throat, and face: Denies mucositis or sore throat Respiratory: Denies cough, dyspnea or wheezes Cardiovascular: Denies palpitation, chest discomfort or lower extremity swelling Gastrointestinal:  Denies nausea, heartburn or change in bowel habits Skin: Denies abnormal skin  rashes Lymphatics: Denies new lymphadenopathy or easy bruising Neurological:Denies numbness, tingling or new weaknesses Behavioral/Psych: Mood is stable, no new changes  Breast:  denies any pain or lumps or nodules in either breasts All other systems were reviewed with the patient and are negative.  I have reviewed the past medical history, past surgical history, social history and family history with the patient and they are unchanged from previous note.  ALLERGIES:  has No Known Allergies.  MEDICATIONS:  Current Outpatient Prescriptions  Medication Sig Dispense Refill  . anastrozole (ARIMIDEX) 1 MG tablet Take 1 tablet (1 mg total) by mouth daily. (Patient not taking: Reported on 08/20/2014) 30 tablet 0  . cholecalciferol (VITAMIN D) 1000 UNITS tablet Take 1,000 Units by mouth daily.    Marland Kitchen emollient (BIAFINE) cream Apply 1 Dose topically 2 (two) times daily.    . hyaluronate sodium (RADIAPLEXRX) GEL Apply 1 application topically once.     No current facility-administered medications for this visit.    PHYSICAL EXAMINATION: ECOG PERFORMANCE STATUS: 1 - Symptomatic but completely ambulatory  Filed Vitals:   12/31/14 1554  BP: 124/82  Pulse: 72  Temp: 98 F (36.7 C)  Resp: 20   Filed Weights   12/31/14 1554  Weight: 156 lb (70.761 kg)    GENERAL:alert, no distress and comfortable SKIN: skin color, texture, turgor are normal, no rashes or significant lesions EYES: normal, Conjunctiva are pink and non-injected, sclera clear OROPHARYNX:no exudate, no erythema and lips, buccal mucosa, and tongue normal  NECK: supple, thyroid normal size, non-tender, without nodularity LYMPH:  no palpable lymphadenopathy in the cervical, axillary or inguinal LUNGS: clear to auscultation and percussion with normal breathing effort HEART: regular rate & rhythm and no murmurs and no lower extremity  edema ABDOMEN:abdomen soft, non-tender and normal bowel sounds Musculoskeletal:no cyanosis of digits  and no clubbing  NEURO: alert & oriented x 3 with fluent speech, no focal motor/sensory deficits  LABORATORY DATA:  I have reviewed the data as listed   Chemistry      Component Value Date/Time   NA 140 07/02/2014 1600   K 3.3* 07/02/2014 1600   CL 104 07/02/2014 1600   CO2 28 07/02/2014 1600   BUN 14 07/02/2014 1600   CREATININE 0.67 07/02/2014 1600      Component Value Date/Time   CALCIUM 9.6 07/02/2014 1600   ALKPHOS 67 12/23/2013 0718   AST 18 12/23/2013 0718   ALT 14 12/23/2013 0718   BILITOT 0.7 12/23/2013 0718       Lab Results  Component Value Date   WBC 6.8 07/02/2014   HGB 12.8 07/02/2014   HCT 37.6 07/02/2014   MCV 89.7 07/02/2014   PLT 205 07/02/2014   NEUTROABS 4.6 07/02/2014   ASSESSMENT & PLAN:  Neoplasm of right breast, primary tumor staging category Tis: ductal carcinoma in situ (DCIS) Right breast DCIS status post lumpectomy 07/04/2014: Intermediate grade DCIS with necrosis and calcification spanning 0.6 cm, less than 0.1 cm to posterior and superior margins, ER 90%, PR 2% S/P XRT 09/16/14; started anastrozole 1 mg daily Aug 2016  Anastrozole Toxicities: Patient denies any hot flashes or myalgias. She is tolerating the treatment extremely well.  RTC in 6 months for follow-up breast exams and surveillance.  No orders of the defined types were placed in this encounter.   The patient has a good understanding of the overall plan. she agrees with it. she will call with any problems that may develop before the next visit here.   Rulon Eisenmenger, MD

## 2014-12-31 NOTE — Telephone Encounter (Signed)
Appointments made and avs printed for pateint °

## 2015-01-14 ENCOUNTER — Other Ambulatory Visit: Payer: Self-pay | Admitting: Hematology and Oncology

## 2015-01-14 DIAGNOSIS — D0511 Intraductal carcinoma in situ of right breast: Secondary | ICD-10-CM

## 2015-01-23 ENCOUNTER — Encounter: Payer: PRIVATE HEALTH INSURANCE | Admitting: Nurse Practitioner

## 2015-01-31 ENCOUNTER — Telehealth: Payer: Self-pay | Admitting: *Deleted

## 2015-02-05 NOTE — Telephone Encounter (Signed)
LM for rtn call x2 Pt missed Suvivorship appt. Calling patient to find out if ok to mail careplan or if she wanted to reschedule.

## 2015-02-07 ENCOUNTER — Encounter: Payer: Self-pay | Admitting: Nurse Practitioner

## 2015-02-07 DIAGNOSIS — D0511 Intraductal carcinoma in situ of right breast: Secondary | ICD-10-CM

## 2015-02-07 NOTE — Progress Notes (Signed)
The Survivorship Care Plan was mailed to [name] as she was unable to come in to the Survivorship Clinic for an in-person visit at this time. A letter was mailed to her outlining the purpose of the content of the care plan, as well as encouraging her to reach out to me with any questions or concerns.  My business card was included in the correspondence to the patient as well.  A copy of the care plan was also routed/faxed/mailed to Gwendolyn Grant, MD, the patient's PCP.  I will not be placing any follow-up appointments to the Survivorship Clinic for [name], but I am happy to see her at any time in the future for any survivorship concerns that may arise. Thank you for allowing me to participate in her care!  Kenn File, [place] [phone]

## 2015-04-07 ENCOUNTER — Other Ambulatory Visit: Payer: Self-pay | Admitting: Obstetrics and Gynecology

## 2015-04-07 DIAGNOSIS — Z9889 Other specified postprocedural states: Secondary | ICD-10-CM

## 2015-04-07 DIAGNOSIS — Z853 Personal history of malignant neoplasm of breast: Secondary | ICD-10-CM

## 2015-04-21 ENCOUNTER — Encounter: Payer: PRIVATE HEALTH INSURANCE | Admitting: Internal Medicine

## 2015-04-22 ENCOUNTER — Encounter: Payer: Self-pay | Admitting: Internal Medicine

## 2015-04-22 ENCOUNTER — Ambulatory Visit (INDEPENDENT_AMBULATORY_CARE_PROVIDER_SITE_OTHER): Payer: PRIVATE HEALTH INSURANCE | Admitting: Internal Medicine

## 2015-04-22 ENCOUNTER — Ambulatory Visit: Payer: PRIVATE HEALTH INSURANCE

## 2015-04-22 DIAGNOSIS — Z0000 Encounter for general adult medical examination without abnormal findings: Secondary | ICD-10-CM | POA: Diagnosis not present

## 2015-04-22 DIAGNOSIS — I341 Nonrheumatic mitral (valve) prolapse: Secondary | ICD-10-CM | POA: Insufficient documentation

## 2015-04-22 NOTE — Patient Instructions (Addendum)
Have a copy of your blood work sent to me if possible.   All other Health Maintenance issues reviewed.   All recommended immunizations and age-appropriate screenings are up-to-date.  No immunizations administered today.   Medications reviewed and updated.   No changes recommended at this time.   Health Maintenance, Female Adopting a healthy lifestyle and getting preventive care can go a long way to promote health and wellness. Talk with your health care provider about what schedule of regular examinations is right for you. This is a good chance for you to check in with your provider about disease prevention and staying healthy. In between checkups, there are plenty of things you can do on your own. Experts have done a lot of research about which lifestyle changes and preventive measures are most likely to keep you healthy. Ask your health care provider for more information. WEIGHT AND DIET  Eat a healthy diet  Be sure to include plenty of vegetables, fruits, low-fat dairy products, and lean protein.  Do not eat a lot of foods high in solid fats, added sugars, or salt.  Get regular exercise. This is one of the most important things you can do for your health.  Most adults should exercise for at least 150 minutes each week. The exercise should increase your heart rate and make you sweat (moderate-intensity exercise).  Most adults should also do strengthening exercises at least twice a week. This is in addition to the moderate-intensity exercise.  Maintain a healthy weight  Body mass index (BMI) is a measurement that can be used to identify possible weight problems. It estimates body fat based on height and weight. Your health care provider can help determine your BMI and help you achieve or maintain a healthy weight.  For females 11 years of age and older:   A BMI below 18.5 is considered underweight.  A BMI of 18.5 to 24.9 is normal.  A BMI of 25 to 29.9 is considered  overweight.  A BMI of 30 and above is considered obese.  Watch levels of cholesterol and blood lipids  You should start having your blood tested for lipids and cholesterol at 52 years of age, then have this test every 5 years.  You may need to have your cholesterol levels checked more often if:  Your lipid or cholesterol levels are high.  You are older than 52 years of age.  You are at high risk for heart disease.  CANCER SCREENING   Lung Cancer  Lung cancer screening is recommended for adults 69-47 years old who are at high risk for lung cancer because of a history of smoking.  A yearly low-dose CT scan of the lungs is recommended for people who:  Currently smoke.  Have quit within the past 15 years.  Have at least a 30-pack-year history of smoking. A pack year is smoking an average of one pack of cigarettes a day for 1 year.  Yearly screening should continue until it has been 15 years since you quit.  Yearly screening should stop if you develop a health problem that would prevent you from having lung cancer treatment.  Breast Cancer  Practice breast self-awareness. This means understanding how your breasts normally appear and feel.  It also means doing regular breast self-exams. Let your health care provider know about any changes, no matter how small.  If you are in your 20s or 30s, you should have a clinical breast exam (CBE) by a health care provider every 1-3  years as part of a regular health exam.  If you are 67 or older, have a CBE every year. Also consider having a breast X-ray (mammogram) every year.  If you have a family history of breast cancer, talk to your health care provider about genetic screening.  If you are at high risk for breast cancer, talk to your health care provider about having an MRI and a mammogram every year.  Breast cancer gene (BRCA) assessment is recommended for women who have family members with BRCA-related cancers. BRCA-related  cancers include:  Breast.  Ovarian.  Tubal.  Peritoneal cancers.  Results of the assessment will determine the need for genetic counseling and BRCA1 and BRCA2 testing. Cervical Cancer Your health care provider may recommend that you be screened regularly for cancer of the pelvic organs (ovaries, uterus, and vagina). This screening involves a pelvic examination, including checking for microscopic changes to the surface of your cervix (Pap test). You may be encouraged to have this screening done every 3 years, beginning at age 62.  For women ages 32-65, health care providers may recommend pelvic exams and Pap testing every 3 years, or they may recommend the Pap and pelvic exam, combined with testing for human papilloma virus (HPV), every 5 years. Some types of HPV increase your risk of cervical cancer. Testing for HPV may also be done on women of any age with unclear Pap test results.  Other health care providers may not recommend any screening for nonpregnant women who are considered low risk for pelvic cancer and who do not have symptoms. Ask your health care provider if a screening pelvic exam is right for you.  If you have had past treatment for cervical cancer or a condition that could lead to cancer, you need Pap tests and screening for cancer for at least 20 years after your treatment. If Pap tests have been discontinued, your risk factors (such as having a new sexual partner) need to be reassessed to determine if screening should resume. Some women have medical problems that increase the chance of getting cervical cancer. In these cases, your health care provider may recommend more frequent screening and Pap tests. Colorectal Cancer  This type of cancer can be detected and often prevented.  Routine colorectal cancer screening usually begins at 52 years of age and continues through 52 years of age.  Your health care provider may recommend screening at an earlier age if you have risk  factors for colon cancer.  Your health care provider may also recommend using home test kits to check for hidden blood in the stool.  A small camera at the end of a tube can be used to examine your colon directly (sigmoidoscopy or colonoscopy). This is done to check for the earliest forms of colorectal cancer.  Routine screening usually begins at age 74.  Direct examination of the colon should be repeated every 5-10 years through 52 years of age. However, you may need to be screened more often if early forms of precancerous polyps or small growths are found. Skin Cancer  Check your skin from head to toe regularly.  Tell your health care provider about any new moles or changes in moles, especially if there is a change in a mole's shape or color.  Also tell your health care provider if you have a mole that is larger than the size of a pencil eraser.  Always use sunscreen. Apply sunscreen liberally and repeatedly throughout the day.  Protect yourself by wearing long  sleeves, pants, a wide-brimmed hat, and sunglasses whenever you are outside. HEART DISEASE, DIABETES, AND HIGH BLOOD PRESSURE   High blood pressure causes heart disease and increases the risk of stroke. High blood pressure is more likely to develop in:  People who have blood pressure in the high end of the normal range (130-139/85-89 mm Hg).  People who are overweight or obese.  People who are African American.  If you are 77-17 years of age, have your blood pressure checked every 3-5 years. If you are 45 years of age or older, have your blood pressure checked every year. You should have your blood pressure measured twice--once when you are at a hospital or clinic, and once when you are not at a hospital or clinic. Record the average of the two measurements. To check your blood pressure when you are not at a hospital or clinic, you can use:  An automated blood pressure machine at a pharmacy.  A home blood pressure  monitor.  If you are between 25 years and 32 years old, ask your health care provider if you should take aspirin to prevent strokes.  Have regular diabetes screenings. This involves taking a blood sample to check your fasting blood sugar level.  If you are at a normal weight and have a low risk for diabetes, have this test once every three years after 52 years of age.  If you are overweight and have a high risk for diabetes, consider being tested at a younger age or more often. PREVENTING INFECTION  Hepatitis B  If you have a higher risk for hepatitis B, you should be screened for this virus. You are considered at high risk for hepatitis B if:  You were born in a country where hepatitis B is common. Ask your health care provider which countries are considered high risk.  Your parents were born in a high-risk country, and you have not been immunized against hepatitis B (hepatitis B vaccine).  You have HIV or AIDS.  You use needles to inject street drugs.  You live with someone who has hepatitis B.  You have had sex with someone who has hepatitis B.  You get hemodialysis treatment.  You take certain medicines for conditions, including cancer, organ transplantation, and autoimmune conditions. Hepatitis C  Blood testing is recommended for:  Everyone born from [birth year] through [birth year].  Anyone with known risk factors for hepatitis C. Sexually transmitted infections (STIs)  You should be screened for sexually transmitted infections (STIs) including gonorrhea and chlamydia if:  You are sexually active and are younger than 52 years of age.  You are older than 52 years of age and your health care provider tells you that you are at risk for this type of infection.  Your sexual activity has changed since you were last screened and you are at an increased risk for chlamydia or gonorrhea. Ask your health care provider if you are at risk.  If you do not have HIV, but are at risk, it may be  recommended that you take a prescription medicine daily to prevent HIV infection. This is called pre-exposure prophylaxis (PrEP). You are considered at risk if:  You are sexually active and do not regularly use condoms or know the HIV status of your partner(s).  You take drugs by injection.  You are sexually active with a partner who has HIV. Talk with your health care provider about whether you are at high risk of being infected with HIV. If you choose  to begin PrEP, you should first be tested for HIV. You should then be tested every 3 months for as long as you are taking PrEP.  PREGNANCY   If you are premenopausal and you may become pregnant, ask your health care provider about preconception counseling.  If you may become pregnant, take 400 to 800 micrograms (mcg) of folic acid every day.  If you want to prevent pregnancy, talk to your health care provider about birth control (contraception). OSTEOPOROSIS AND MENOPAUSE   Osteoporosis is a disease in which the bones lose minerals and strength with aging. This can result in serious bone fractures. Your risk for osteoporosis can be identified using a bone density scan.  If you are 55 years of age or older, or if you are at risk for osteoporosis and fractures, ask your health care provider if you should be screened.  Ask your health care provider whether you should take a calcium or vitamin D supplement to lower your risk for osteoporosis.  Menopause may have certain physical symptoms and risks.  Hormone replacement therapy may reduce some of these symptoms and risks. Talk to your health care provider about whether hormone replacement therapy is right for you.  HOME CARE INSTRUCTIONS   Schedule regular health, dental, and eye exams.  Stay current with your immunizations.   Do not use any tobacco products including cigarettes, chewing tobacco, or electronic cigarettes.  If you are pregnant, do not drink alcohol.  If you are  breastfeeding, limit how much and how often you drink alcohol.  Limit alcohol intake to no more than 1 drink per day for nonpregnant women. One drink equals 12 ounces of beer, 5 ounces of wine, or 1 ounces of hard liquor.  Do not use street drugs.  Do not share needles.  Ask your health care provider for help if you need support or information about quitting drugs.  Tell your health care provider if you often feel depressed.  Tell your health care provider if you have ever been abused or do not feel safe at home.   This information is not intended to replace advice given to you by your health care provider. Make sure you discuss any questions you have with your health care provider.   Document Released: 09/21/2010 Document Revised: 03/29/2014 Document Reviewed: 02/07/2013 Elsevier Interactive Patient Education [employer].

## 2015-04-22 NOTE — Assessment & Plan Note (Signed)
Taking vitamin D daily Exercises regularly - weight baring  -walking or ellitical

## 2015-04-22 NOTE — Progress Notes (Signed)
Subjective:    Patient ID: Buel Ream, female    DOB: [birth year], 50 y.o.   MRN: [id number]  HPI She is here to establish with a new pcp.   She is here for a physical.   In the last year she was diagnosed with right breast cancer - DCIS.  Had a lumpectomy, radiation, and she is on anastrozole.  She feels a little muscle aches.   GERD:  She has occasional GERD at night or early in the morning.  Eating after 8 pm or certain foods.  Occurs less than 1 time per week.    Medications and allergies reviewed with patient and updated if appropriate.  Patient Active Problem List   Diagnosis Date Noted  . MVP (mitral valve prolapse) 04/22/2015  . Genetic testing 06/10/2014  . Neoplasm of right breast, primary tumor staging category Tis: ductal carcinoma in situ (DCIS) 05/27/2014  . Osteopenia 10/09/2007    Current Outpatient Prescriptions on File Prior to Visit  Medication Sig Dispense Refill  . anastrozole (ARIMIDEX) 1 MG tablet TAKE 1 TABLET BY MOUTH ONCE DAILY 90 tablet 3  . cholecalciferol (VITAMIN D) 1000 UNITS tablet Take 1,000 Units by mouth daily. Reported on 04/22/2015     No current facility-administered medications on file prior to visit.    Past Medical History  Diagnosis Date  . History of kidney stones   . Mitral valve prolapse     very mild, per pt.  . Neoplasm of right breast, primary tumor staging category Tis: ductal carcinoma in situ (DCIS) 05/2014  . Breast cancer, right breast ([place]) 05/17/14    DUCTAL CARCINOMA IN SITU WITH CALCIFICATIONS  . S/P radiation therapy 08/05/2014 through 09/16/2014    Right breast 4800 cGy in 24 sessions, right breast tumor bed boost 1200 cGy in 6 sessions, for a cumulative tumor bed dose of 6000 cGy in 30 sessions    . Shingles     Past Surgical History  Procedure Laterality Date  . Wisdom tooth extraction    . Breast lumpectomy with radioactive seed localization Right 07/04/2014    Procedure: BREAST  LUMPECTOMY WITH RADIOACTIVE SEED LOCALIZATION;  Surgeon: Rolm Bookbinder, MD;  Location: [facility];  Service: General;  Laterality: Right;    Social History   Social History  . Marital Status: Married    Spouse Name: Coralyn Mark  . Number of Children: 2  . Years of Education: N/A   Social History Main Topics  . Smoking status: Never Smoker   . Smokeless tobacco: Never Used  . Alcohol Use: Yes     Comment: 1-2 drinks/week  . Drug Use: No  . Sexual Activity: Yes    Birth Control/ Protection: Post-menopausal   Other Topics Concern  . None   Social History Narrative   Married. Husband is [employer].       Exercising regularly: walking, elliptical, weights    Family History  Problem Relation Age of Onset  . Colon cancer Father 93  . CAD Father 63    CABG x 2 - age 72s, 44s  . Hypertension Father   . Hyperlipidemia Father   . Breast cancer Mother 35    DCIS  . Cancer Paternal Grandfather 50    spinal tumor  . Breast cancer Other     MGM's sister dx over 12  . Breast cancer Other     MGF's sister dx over 36  . CAD Paternal 81  Review of Systems  Constitutional: Negative for fever, chills, appetite change and unexpected weight change.  HENT: Negative for hearing loss.   Eyes: Negative for visual disturbance.  Respiratory: Negative for cough, shortness of breath and wheezing.   Cardiovascular: Positive for palpitations (rare). Negative for chest pain and leg swelling.  Gastrointestinal: Negative for nausea, abdominal pain, diarrhea, constipation and blood in stool.       Occ GERD  Genitourinary: Negative for dysuria and hematuria.  Musculoskeletal: Positive for back pain (rarea back pain - intermittent - related to lifting) and arthralgias (right knee).  Neurological: Positive for headaches (dehydration only). Negative for dizziness, weakness, light-headedness and numbness.  Psychiatric/Behavioral: Negative for dysphoric mood. The patient  is not nervous/anxious.        Objective:   Filed Vitals:   04/22/15 1415  BP: 130/86  Pulse: 58  Temp: 98.2 F (36.8 C)  Resp: 16   Filed Weights   04/22/15 1415  Weight: 156 lb (70.761 kg)   Body mass index is 23.73 kg/(m^2).   Physical Exam Constitutional: She appears well-developed and well-nourished. No distress.  HENT:  Head: Normocephalic and atraumatic.  Right Ear: External ear normal. Normal ear canal and TM Left Ear: External ear normal.  Normal ear canal and TM Mouth/Throat: Oropharynx is clear and moist.  Normal bilateral ear canals and tympanic membranes  Eyes: Conjunctivae and EOM are normal.  Neck: Neck supple. No tracheal deviation present. No thyromegaly present.  No carotid bruit  Cardiovascular: Normal rate, regular rhythm and normal heart sounds.   No murmur heard.  No edema. Pulmonary/Chest: Effort normal and breath sounds normal. No respiratory distress. She has no wheezes. She has no rales.  Breast: deferred to Gyn Abdominal: Soft. She exhibits no distension. There is no tenderness.  Lymphadenopathy: She has no cervical adenopathy.  Skin: Skin is warm and dry. She is not diaphoretic.  Psychiatric: She has a normal mood and affect. Her behavior is normal.       Assessment & Plan:   Physical exam: Screening blood work-we'll have them next week through GYN Immunizations-up to date, discussed shingles vaccine Colonoscopy-she plans on getting that done in the next few months Mammogram-up to date Gyn-up-to-date Dexa-up-to-date-done through [place] exams-up-to-date EKG-reviewed 2012 EKG and was normal. No symptoms and has good exercise tolerance. We'll defer another EKG today Exercise-exercising regularly Weight-BMI normal range, but she is working on weight loss Skin -no concerns. Has seen dermatology in the past Substance abuse -no evidence of substance abuse She is not always consistent with taking her vitamin D. She had a regular  basis  Breast cancer Following with oncology and GYN regularly Overall mentally emotionally doing well Tolerating Arimidex well, possibly some increased muscle aches that are mild

## 2015-05-02 MED FILL — VIT D2 1.25 MG (50,000 UNIT: 1.25 MG | 28 days supply | Qty: 4 | Fill #0 | Status: TO

## 2015-05-06 ENCOUNTER — Ambulatory Visit: Payer: PRIVATE HEALTH INSURANCE | Admitting: Family Medicine

## 2015-05-21 ENCOUNTER — Ambulatory Visit: Payer: PRIVATE HEALTH INSURANCE | Admitting: Family Medicine

## 2015-06-03 MED FILL — VIT D2 1.25 MG (50,000 UNIT: 1.25 MG | 69 days supply | Qty: 10 | Fill #0

## 2015-06-17 ENCOUNTER — Ambulatory Visit: Payer: PRIVATE HEALTH INSURANCE | Admitting: Family Medicine

## 2015-06-17 ENCOUNTER — Encounter: Payer: Self-pay | Admitting: Family Medicine

## 2015-06-17 ENCOUNTER — Ambulatory Visit (INDEPENDENT_AMBULATORY_CARE_PROVIDER_SITE_OTHER): Payer: PRIVATE HEALTH INSURANCE | Admitting: Family Medicine

## 2015-06-17 ENCOUNTER — Other Ambulatory Visit (INDEPENDENT_AMBULATORY_CARE_PROVIDER_SITE_OTHER): Payer: PRIVATE HEALTH INSURANCE

## 2015-06-17 VITALS — BP 122/82 | HR 69 | Ht 68.0 in | Wt 148.0 lb

## 2015-06-17 DIAGNOSIS — M25561 Pain in right knee: Secondary | ICD-10-CM | POA: Diagnosis not present

## 2015-06-17 DIAGNOSIS — M23000 Cystic meniscus, unspecified lateral meniscus, right knee: Secondary | ICD-10-CM

## 2015-06-17 DIAGNOSIS — M23003 Cystic meniscus, unspecified medial meniscus, right knee: Secondary | ICD-10-CM

## 2015-06-17 DIAGNOSIS — M23006 Cystic meniscus, unspecified meniscus, right knee: Secondary | ICD-10-CM | POA: Diagnosis not present

## 2015-06-17 DIAGNOSIS — M23009 Cystic meniscus, unspecified meniscus, unspecified knee: Secondary | ICD-10-CM | POA: Insufficient documentation

## 2015-06-17 NOTE — Patient Instructions (Signed)
Good to see you.  Ice 20 minutes 2 times daily. Usually after activity and before bed. Exercises 3 times a week.  Xray downstairs today  pennsaid pinkie amount topically 2 times daily as needed.  Good shoes with walking and walk only 3 times a week.  Elliptical and bike would be great  Continue the vitamin D until pills are gone Turmeric 500mg  2 times daily  See me again in 4 weeks and if not better we will do injection and give you a brace for the trip

## 2015-06-17 NOTE — Assessment & Plan Note (Signed)
Patient does have what appears to be more of a. Meniscal cyst. I do think the patient has an underlying tear based on patient's symptoms. Very difficult to assess on the ultrasound today. We discussed icing regimen. We discussed home exercises. Patient will try vitamin D supplementation. Given trial topical anti-inflammatories. Patient wants to try conservative therapy. Patient come back again in 4 weeks. If having worsening pain I would consider injection as well as bracing with her having a hiking excursion in May.

## 2015-06-17 NOTE — Progress Notes (Signed)
Pre visit review using our clinic review tool, if applicable. No additional management support is needed unless otherwise documented below in the visit note. 

## 2015-06-17 NOTE — Progress Notes (Signed)
[facility] [address], [place] [address] Phone: [phone] Subjective:    I'm seeing this patient by the request  of:  Binnie Rail, MD   CC: Right knee pain  [id number] Rebekah Larson is a 50 y.o. female coming in with complaint of Right knee pain. Has had this pain for multiple months. Does not remember any true injury. Seems to be worsening slightly. Patient states that she works out at any point she has swelling that seems to be localized. States the swelling goes down after approximately 2 days. Patient states that the swelling causes more of a stiffness. Denies any instability but states that if she twists sometimes she can have a severe pain. Patient states that daily activities seem to be doing rather well. Has not tried any home modalities at this time. Rates the severity of pain a 6 out of 10. Denies any radiation of pain or any numbness.     Past Medical History  Diagnosis Date  . History of kidney stones   . Mitral valve prolapse     very mild, per pt.  . Neoplasm of right breast, primary tumor staging category Tis: ductal carcinoma in situ (DCIS) 05/2014  . Breast cancer, right breast ([facility]) 05/17/14    DUCTAL CARCINOMA IN SITU WITH CALCIFICATIONS  . S/P radiation therapy 08/05/2014 through 09/16/2014    Right breast 4800 cGy in 24 sessions, right breast tumor bed boost 1200 cGy in 6 sessions, for a cumulative tumor bed dose of 6000 cGy in 30 sessions    . Shingles    Past Surgical History  Procedure Laterality Date  . Wisdom tooth extraction    . Breast lumpectomy with radioactive seed localization Right 07/04/2014    Procedure: BREAST LUMPECTOMY WITH RADIOACTIVE SEED LOCALIZATION;  Surgeon: Rolm Bookbinder, MD;  Location: [facility];  Service: General;  Laterality: Right;   Social History   Social History  . Marital Status: Married    Spouse Name: Coralyn Mark  . Number of Children: 2  .  Years of Education: N/A   Social History Main Topics  . Smoking status: Never Smoker   . Smokeless tobacco: Never Used  . Alcohol Use: Yes     Comment: 1-2 drinks/week  . Drug Use: No  . Sexual Activity: Yes    Birth Control/ Protection: Post-menopausal   Other Topics Concern  . None   Social History Narrative   Married. Husband is CEO [employer].       Exercising regularly: walking, elliptical, weights   No Known Allergies Family History  Problem Relation Age of Onset  . Colon cancer Father 37  . CAD Father 71    CABG x 2 - age 39s, 21s  . Hypertension Father   . Hyperlipidemia Father   . Breast cancer Mother 26    DCIS  . Cancer Paternal Grandfather 50    spinal tumor  . Breast cancer Other     MGM's sister dx over 38  . Breast cancer Other     MGF's sister dx over 45  . CAD Paternal Aunt     Past medical history, social, surgical and family history all reviewed in electronic medical record.  No pertanent information unless stated regarding to the chief complaint.   Review of Systems: No headache, visual changes, nausea, vomiting, diarrhea, constipation, dizziness, abdominal pain, skin rash, fevers, chills, night sweats, weight loss, swollen lymph nodes, body aches, joint swelling,  muscle aches, chest pain, shortness of breath, mood changes.   Objective Blood pressure 122/82, pulse 69, height 5\' 8"  (1.727 m), weight 148 lb (67.132 kg), last menstrual period 07/10/2009, SpO2 99 %.  General: No apparent distress alert and oriented x3 mood and affect normal, dressed appropriately.  HEENT: Pupils equal, extraocular movements intact  Respiratory: Patient's speak in full sentences and does not appear short of breath  Cardiovascular: No lower extremity edema, non tender, no erythema  Skin: Warm dry intact with no signs of infection or rash on extremities or on axial skeleton.  Abdomen: Soft nontender  Neuro: Cranial nerves II through XII are intact,  neurovascularly intact in all extremities with 2+ DTRs and 2+ pulses.  Lymph: No lymphadenopathy of posterior or anterior cervical chain or axillae bilaterally.  Gait normal with good balance and coordination.  MSK:  Non tender with full range of motion and good stability and symmetric strength and tone of shoulders, elbows, wrist, hip, and ankles bilaterally.  Knee: Right  Normal to inspection with no erythema or effusion or obvious bony abnormalities. Mild lateral tracking of the patella Mild pain over the anterior lateral and medial joint lines ROM full in flexion and extension and lower leg rotation. Ligaments with solid consistent endpoints including ACL, PCL, LCL, MCL. Positive Mcmurray's, Apley's, and Thessalonian tests. Non painful patellar compression. Patellar glide with minimal crepitus. Patellar and quadriceps tendons unremarkable. Hamstring and quadriceps strength is normal.  Contralateral knee has lateral tracking as well but nontender  MSK US performed of: Right This study was ordered, performed, and interpreted by Charlann Boxer D.O.  Knee: All structures visualized. Anterior lateral meniscus has what appears to be an underlying degenerative change of the meniscus as well as an overlying. Meniscal cyst. Increasing Doppler flow. Patellar Tendon unremarkable on long and transverse views without effusion. No abnormality of prepatellar bursa. LCL and MCL unremarkable on long and transverse views. No abnormality of origin of medial or lateral head of the gastrocnemius.  IMPRESSION:  Perimeniscal cyst with underlying meniscal tear  Procedure note [id number]; 15 minutes spent for Therapeutic exercises as stated in above notes.  This included exercises focusing on stretching, strengthening, with significant focus on eccentric aspects. Flexion and extension exercises working on vastus medialis oblique strengthening as well as hip abductor strengthening   Proper technique shown and  discussed handout in great detail with ATC.  All questions were discussed and answered.     Impression and Recommendations:     This case required medical decision making of moderate complexity.      Note: This dictation was prepared with Dragon dictation along with smaller phrase technology. Any transcriptional errors that result from this process are unintentional.

## 2015-06-26 ENCOUNTER — Telehealth: Payer: Self-pay

## 2015-06-26 NOTE — Telephone Encounter (Signed)
LMOVM - overbooked on 4/18. Pt to return call if we can reschedule her 

## 2015-07-04 ENCOUNTER — Telehealth: Payer: Self-pay | Admitting: Hematology and Oncology

## 2015-07-04 NOTE — Telephone Encounter (Signed)
pt called and rescheduled appt for may

## 2015-07-08 ENCOUNTER — Ambulatory Visit: Payer: PRIVATE HEALTH INSURANCE | Admitting: Hematology and Oncology

## 2015-07-15 ENCOUNTER — Ambulatory Visit: Payer: PRIVATE HEALTH INSURANCE | Admitting: Family Medicine

## 2015-07-15 DIAGNOSIS — Z0289 Encounter for other administrative examinations: Secondary | ICD-10-CM

## 2015-07-22 ENCOUNTER — Ambulatory Visit (HOSPITAL_BASED_OUTPATIENT_CLINIC_OR_DEPARTMENT_OTHER): Payer: PRIVATE HEALTH INSURANCE | Admitting: Hematology and Oncology

## 2015-07-22 ENCOUNTER — Encounter: Payer: Self-pay | Admitting: Hematology and Oncology

## 2015-07-22 ENCOUNTER — Telehealth: Payer: Self-pay | Admitting: Hematology and Oncology

## 2015-07-22 VITALS — BP 108/76 | HR 65 | Temp 97.6°F | Resp 18 | Ht 68.0 in | Wt 152.6 lb

## 2015-07-22 DIAGNOSIS — D0511 Intraductal carcinoma in situ of right breast: Secondary | ICD-10-CM | POA: Diagnosis not present

## 2015-07-22 NOTE — Progress Notes (Signed)
Patient Care Team: Binnie Rail, MD as PCP - General (Internal Medicine) Rolm Bookbinder, MD (General Surgery) Dian Queen, MD (Obstetrics and Gynecology) Nicholas Lose, MD (Hematology and Oncology) Arloa Koh, MD as Consulting Physician (Radiation Oncology) Sylvan Cheese, NP as Nurse Practitioner (Hematology and Oncology)  DIAGNOSIS: Neoplasm of right breast, primary tumor staging category Tis: ductal carcinoma in situ (DCIS)   Staging form: Breast, AJCC 7th Edition     Clinical stage from 05/17/2014: Stage 0 (Tis (DCIS), N0, M0) - Unsigned     Pathologic stage from 07/04/2014: Stage 0 (Tis (DCIS), N0, cM0) - Unsigned   SUMMARY OF ONCOLOGIC HISTORY:   Neoplasm of right breast, primary tumor staging category Tis: ductal carcinoma in situ (DCIS)   04/16/2014 Mammogram Right breast: loose group of calcifications measuring 10 x 5 x 12 mm, slightly coarse and slightly heterogeneous within posterior upper breast   04/27/2014 Breast MRI No suspicious areas of non mass enhancement identified within either the right or left breast.   05/17/2014 Initial Biopsy Right breast core needle bx: DCIS with calcifications, intermediate to high grade, ER+ (98%), PR+ (2%).   05/17/2014 Clinical Stage Stage 0: Tis N0   05/27/2014 Procedure Genetic testing: BRCA Plus ([facility]) panel reveals no clinically significant variant at BRCA1, BRCA2, CDH1, PALB2, PTEN, and TP53.   07/04/2014 Definitive Surgery Right breast lumpectomy: DCIS, intermediate grade with necrosis and calcifications, spanning 0.6 cm, less than 0.1 cm to posterior and superior margin, ER+ (98%), PR+ (2%)   07/04/2014 Pathologic Stage Stage 0: pTis pN0   08/05/2014 - 09/16/2014 Radiation Therapy Adjuvant RT Valere Dross): Right breast 48 Gy over 24 fractions; right breast tumor bed boost 12 Gy over 6 fractions; total dose 60 Gy      12/02/2014 -  Anti-estrogen oral therapy Anastrozole 1 mg daily. Planned duration of therapy 5  years.   02/07/2015 Survivorship Survivorship care plan completed and mailed to patient.    CHIEF COMPLIANT: Follow-up on anastrozole, complains of muscle stiffness and achiness  INTERVAL HISTORY: Rebekah Larson is a 50 year old with above-mentioned history of right breast DCIS underwent lumpectomy and adjuvant radiation and is currently on anastrozole. She complains of muscle achiness related to anastrozole therapy. However it is not severe enough to cause her trouble. She has noticed it more often after she's been sedentary for a while. She is staying very active and is getting ready to go on a hike in the mountains for 6 days along with her high school children. Her sister was recently diagnosed with DCIS in [place].  REVIEW OF SYSTEMS:   Constitutional: Denies fevers, chills or abnormal weight loss, complains of musculoskeletal aches and pains  Eyes: Denies blurriness of vision Ears, nose, mouth, throat, and face: Denies mucositis or sore throat Respiratory: Denies cough, dyspnea or wheezes Cardiovascular: Denies palpitation, chest discomfort Gastrointestinal:  Denies nausea, heartburn or change in bowel habits Skin: Denies abnormal skin rashes Lymphatics: Denies new lymphadenopathy or easy bruising Neurological:Denies numbness, tingling or new weaknesses Behavioral/Psych: Mood is stable, no new changes  Extremities: No lower extremity edema Breast:  denies any pain or lumps or nodules in either breasts All other systems were reviewed with the patient and are negative.  I have reviewed the past medical history, past surgical history, social history and family history with the patient and they are unchanged from previous note.  ALLERGIES:  has No Known Allergies.  MEDICATIONS:  Current Outpatient Prescriptions  Medication Sig Dispense Refill  . anastrozole (ARIMIDEX) 1 MG  tablet TAKE 1 TABLET BY MOUTH ONCE DAILY 90 tablet 3  . cholecalciferol (VITAMIN D) 1000 UNITS tablet Take  1,000 Units by mouth daily. Reported on 04/22/2015     No current facility-administered medications for this visit.    PHYSICAL EXAMINATION: ECOG PERFORMANCE STATUS: 1 - Symptomatic but completely ambulatory  Filed Vitals:   07/22/15 1428  BP: 108/76  Pulse: 65  Temp: 97.6 F (36.4 C)  Resp: 18   Filed Weights   07/22/15 1428  Weight: 152 lb 9.6 oz (69.219 kg)    GENERAL:alert, no distress and comfortable SKIN: skin color, texture, turgor are normal, no rashes or significant lesions EYES: normal, Conjunctiva are pink and non-injected, sclera clear OROPHARYNX:no exudate, no erythema and lips, buccal mucosa, and tongue normal  NECK: supple, thyroid normal size, non-tender, without nodularity LYMPH:  no palpable lymphadenopathy in the cervical, axillary or inguinal LUNGS: clear to auscultation and percussion with normal breathing effort HEART: regular rate & rhythm and no murmurs and no lower extremity edema ABDOMEN:abdomen soft, non-tender and normal bowel sounds MUSCULOSKELETAL:no cyanosis of digits and no clubbing  NEURO: alert & oriented x 3 with fluent speech, no focal motor/sensory deficits EXTREMITIES: No lower extremity edema BREAST: No palpable masses or nodules in either right or left breasts. No palpable axillary supraclavicular or infraclavicular adenopathy no breast tenderness or nipple discharge. (exam performed in the presence of a chaperone)  LABORATORY DATA:  I have reviewed the data as listed   Chemistry      Component Value Date/Time   NA 140 07/02/2014 1600   K 3.3* 07/02/2014 1600   CL 104 07/02/2014 1600   CO2 28 07/02/2014 1600   BUN 14 07/02/2014 1600   CREATININE 0.67 07/02/2014 1600      Component Value Date/Time   CALCIUM 9.6 07/02/2014 1600   ALKPHOS 67 12/23/2013 0718   AST 18 12/23/2013 0718   ALT 14 12/23/2013 0718   BILITOT 0.7 12/23/2013 0718       Lab Results  Component Value Date   WBC 6.8 07/02/2014   HGB 12.8 07/02/2014    HCT 37.6 07/02/2014   MCV 89.7 07/02/2014   PLT 205 07/02/2014   NEUTROABS 4.6 07/02/2014   ASSESSMENT & PLAN:  Neoplasm of right breast, primary tumor staging category Tis: ductal carcinoma in situ (DCIS) Right breast DCIS status post lumpectomy 07/04/2014: Intermediate grade DCIS with necrosis and calcification spanning 0.6 cm, less than 0.1 cm to posterior and superior margins, ER 90%, PR 2% S/P XRT 09/16/14; started anastrozole 1 mg daily Aug 2016  Anastrozole Toxicities:Patient denies any hot flashes or myalgias. She is tolerating the treatment extremely well. Breast Cancer Surveillance: 1. Breast exam 07/22/2015: Normal 2. Mammogram 07/26/14 No abnormalities. Postsurgical changes. Breast Density Category C. I recommended that she get 3-D mammograms for surveillance. Discussed the differences between different breast density categories. 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  I encouraged her to do yoga as well as taking turmeric along with anti-inflammatory like aspirin or ibuprofen for pains.  RTC in 6 months and after that we can see her once a year.  No orders of the defined types were placed in this encounter.   The patient has a good understanding of the overall plan. she  agrees with it. she will call with any problems that may develop before the next visit here.   Rulon Eisenmenger, MD 07/22/2015

## 2015-07-22 NOTE — Telephone Encounter (Signed)
Gave and printed appt sched and avs fo rpt; for NOV  °

## 2015-07-22 NOTE — Assessment & Plan Note (Addendum)
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 She is tolerating the treatment extremely well. Breast Cancer Surveillance: 1. Breast exam 07/22/2015: Normal 2. Mammogram 07/26/14 No abnormalities. Postsurgical changes. Breast Density Category C. I recommended that she get 3-D mammograms for surveillance. Discussed the differences between different breast density categories.   Survivorship: Discussed the importance of physical exercise in decreasing the likelihood of breast cancer recurrence. Recommended 30 mins daily 6 days a week of either brisk walking or cycling or swimming. Encouraged patient to eat more fruits and vegetables and decrease red meat.   RTC in 6 months

## 2015-07-29 ENCOUNTER — Ambulatory Visit
Admission: RE | Admit: 2015-07-29 | Discharge: 2015-07-29 | Disposition: A | Discharge status: Home / Self Care | Payer: PRIVATE HEALTH INSURANCE | Source: Ambulatory Visit | Attending: Obstetrics and Gynecology | Admitting: Obstetrics and Gynecology

## 2015-07-29 DIAGNOSIS — Z9889 Other specified postprocedural states: Secondary | ICD-10-CM

## 2015-07-29 DIAGNOSIS — Z853 Personal history of malignant neoplasm of breast: Secondary | ICD-10-CM

## 2015-08-05 ENCOUNTER — Other Ambulatory Visit: Payer: Self-pay | Admitting: Hematology and Oncology

## 2015-08-05 MED FILL — ANASTROZOLE 1 MG TABLET: 1 | 30 days supply | Qty: 30 | Fill #0

## 2016-01-27 ENCOUNTER — Ambulatory Visit: Payer: PRIVATE HEALTH INSURANCE | Admitting: Hematology and Oncology

## 2016-02-18 NOTE — Assessment & Plan Note (Signed)
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 She has mild-mod myalgias  Breast Cancer Surveillance: 1. Breast exam 07/22/2015: Normal 2. Mammogram 07/26/14 No abnormalities. Postsurgical changes. Breast Density Category C. I recommended that she get 3-D mammograms for surveillance. Discussed the differences between different breast density categories. 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  I encouraged her to do yoga as well as taking turmeric along with anti-inflammatory like aspirin or ibuprofen for pains.  RTC in 1 year for follow up

## 2016-02-19 ENCOUNTER — Ambulatory Visit (HOSPITAL_BASED_OUTPATIENT_CLINIC_OR_DEPARTMENT_OTHER): Payer: PRIVATE HEALTH INSURANCE | Admitting: Hematology and Oncology

## 2016-02-19 ENCOUNTER — Encounter: Payer: Self-pay | Admitting: Hematology and Oncology

## 2016-02-19 DIAGNOSIS — M791 Myalgia: Secondary | ICD-10-CM

## 2016-02-19 DIAGNOSIS — D0511 Intraductal carcinoma in situ of right breast: Secondary | ICD-10-CM

## 2016-02-19 NOTE — Progress Notes (Signed)
Patient Care Team: Binnie Rail, MD as PCP - General (Internal Medicine) Rolm Bookbinder, MD (General Surgery) Dian Queen, MD (Obstetrics and Gynecology) Nicholas Lose, MD (Hematology and Oncology) Arloa Koh, MD as Consulting Physician (Radiation Oncology) Sylvan Cheese, NP as Nurse Practitioner (Hematology and Oncology)  DIAGNOSIS:  Encounter Diagnosis  Name Primary?  . Neoplasm of right breast, primary tumor staging category Tis: ductal carcinoma in situ (DCIS)     SUMMARY OF ONCOLOGIC HISTORY:   Neoplasm of right breast, primary tumor staging category Tis: ductal carcinoma in situ (DCIS)   04/16/2014 Mammogram    Right breast: loose group of calcifications measuring 10 x 5 x 12 mm, slightly coarse and slightly heterogeneous within posterior upper breast      04/27/2014 Breast MRI    No suspicious areas of non mass enhancement identified within either the right or left breast.      05/17/2014 Initial Biopsy    Right breast core needle bx: DCIS with calcifications, intermediate to high grade, ER+ (98%), PR+ (2%).      05/17/2014 Clinical Stage    Stage 0: Tis N0      05/27/2014 Procedure    Genetic testing: BRCA Plus ([facility]) panel reveals no clinically significant variant at BRCA1, BRCA2, CDH1, PALB2, PTEN, and TP53.      07/04/2014 Definitive Surgery    Right breast lumpectomy: DCIS, intermediate grade with necrosis and calcifications, spanning 0.6 cm, less than 0.1 cm to posterior and superior margin, ER+ (98%), PR+ (2%)      07/04/2014 Pathologic Stage    Stage 0: pTis pN0      08/05/2014 - 09/16/2014 Radiation Therapy    Adjuvant RT Valere Dross): Right breast 48 Gy over 24 fractions; right breast tumor bed boost 12 Gy over 6 fractions; total dose 60 Gy         12/02/2014 -  Anti-estrogen oral therapy    Anastrozole 1 mg daily. Planned duration of therapy 5 years.      02/07/2015 Survivorship    Survivorship care plan completed and  mailed to patient.       CHIEF COMPLIANT:   folow-up on anastrozoe   INTERVAL HISTORY: Rebekah Larson is a  50 year old with the above-mentioned history of right breast DCIS intermediate grade 1 underwent lumpectomy followed by adjuvant radiation and is currently on anastrozole therapy since September 2016. She has hot flashes and myalgias related to the treatment. The myalgias appear to be consistent and have not improved significantly. He stays very busy with the schoolwork.  REVIEW OF SYSTEMS:   Constitutional: Denies fevers, chills or abnormal weight loss Eyes: Denies blurriness of vision Ears, nose, mouth, throat, and face: Denies mucositis or sore throat Respiratory: Denies cough, dyspnea or wheezes Cardiovascular: Denies palpitation, chest discomfort Gastrointestinal:  Denies nausea, heartburn or change in bowel habits Skin: Denies abnormal skin rashes Lymphatics: Denies new lymphadenopathy or easy bruising Neurological:Denies numbness, tingling or new weaknesses Behavioral/Psych: Mood is stable, no new changes  Extremities: No lower extremity edema Breast:  denies any pain or lumps or nodules in either breasts All other systems were reviewed with the patient and are negative.  I have reviewed the past medical history, past surgical history, social history and family history with the patient and they are unchanged from previous note.  ALLERGIES:  has No Known Allergies.  MEDICATIONS:  Current Outpatient Prescriptions  Medication Sig Dispense Refill  . anastrozole (ARIMIDEX) 1 MG tablet TAKE 1 TABLET BY MOUTH ONCE DAILY 90  tablet 3  . cholecalciferol (VITAMIN D) 1000 UNITS tablet Take 1,000 Units by mouth daily. Reported on 04/22/2015     No current facility-administered medications for this visit.     PHYSICAL EXAMINATION: ECOG PERFORMANCE STATUS: 1 - Symptomatic but completely ambulatory  There were no vitals filed for this visit. There were no vitals filed for  this visit.  GENERAL:alert, no distress and comfortable SKIN: skin color, texture, turgor are normal, no rashes or significant lesions EYES: normal, Conjunctiva are pink and non-injected, sclera clear OROPHARYNX:no exudate, no erythema and lips, buccal mucosa, and tongue normal  NECK: supple, thyroid normal size, non-tender, without nodularity LYMPH:  no palpable lymphadenopathy in the cervical, axillary or inguinal LUNGS: clear to auscultation and percussion with normal breathing effort HEART: regular rate & rhythm and no murmurs and no lower extremity edema ABDOMEN:abdomen soft, non-tender and normal bowel sounds MUSCULOSKELETAL:no cyanosis of digits and no clubbing  NEURO: alert & oriented x 3 with fluent speech, no focal motor/sensory deficits EXTREMITIES: No lower extremity edema BREAST: No palpable masses or nodules in either right or left breasts. No palpable axillary supraclavicular or infraclavicular adenopathy no breast tenderness or nipple discharge. (exam performed in the presence of a chaperone)  LABORATORY DATA:  I have reviewed the data as listed   Chemistry      Component Value Date/Time   NA 140 07/02/2014 1600   K 3.3 (L) 07/02/2014 1600   CL 104 07/02/2014 1600   CO2 28 07/02/2014 1600   BUN 14 07/02/2014 1600   CREATININE 0.67 07/02/2014 1600      Component Value Date/Time   CALCIUM 9.6 07/02/2014 1600   ALKPHOS 67 12/23/2013 0718   AST 18 12/23/2013 0718   ALT 14 12/23/2013 0718   BILITOT 0.7 12/23/2013 0718       Lab Results  Component Value Date   WBC 6.8 07/02/2014   HGB 12.8 07/02/2014   HCT 37.6 07/02/2014   MCV 89.7 07/02/2014   PLT 205 07/02/2014   NEUTROABS 4.6 07/02/2014    ASSESSMENT & PLAN:  Neoplasm of right breast, primary tumor staging category Tis: ductal carcinoma in situ (DCIS) Right breast DCIS status post lumpectomy 07/04/2014: Intermediate grade DCIS with necrosis and calcification spanning 0.6 cm, less than 0.1 cm to  posterior and superior margins, ER 90%, PR 2% S/P XRT 09/16/14; started anastrozole 1 mg daily Aug 2016  Anastrozole Toxicities:Patient denies any hot flashes or myalgias. She has mild-mod myalgias  Breast Cancer Surveillance: 1. Breast exam 02/19/2016: Normal 2. Mammogram 07/29/15 No abnormalities. Postsurgical changes. Breast Density Category C. I recommended that she get 3-D mammograms for surveillance. Discussed the differences between different breast density categories. 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  The patient has a good understanding of the overall plan. she agrees with it. she will call with any problems that may develop before the next visit here.   Rulon Eisenmenger, MD 02/19/16

## 2016-05-18 ENCOUNTER — Other Ambulatory Visit: Payer: Self-pay | Admitting: Obstetrics and Gynecology

## 2016-05-18 ENCOUNTER — Other Ambulatory Visit: Payer: Self-pay | Admitting: Hematology and Oncology

## 2016-05-18 DIAGNOSIS — Z853 Personal history of malignant neoplasm of breast: Secondary | ICD-10-CM

## 2016-06-29 ENCOUNTER — Other Ambulatory Visit: Payer: Self-pay | Admitting: Internal Medicine

## 2016-06-29 DIAGNOSIS — L309 Dermatitis, unspecified: Secondary | ICD-10-CM

## 2016-06-29 MED ORDER — DESONIDE 0.05 % EX CREA: TOPICAL_CREAM | Freq: Two times a day (BID) | CUTANEOUS | 0 refills | Status: DC

## 2016-06-29 MED FILL — DESONIDE 0.05% CREAM: 0.05 | 7 days supply | Qty: 15 | Fill #0

## 2016-06-29 NOTE — Progress Notes (Signed)
Persistent rash on wrist after chemical exposure. Affected area is red, raised  and blanches to touch. It does not currently itch. It is in the area of the patients watch.  Atopic rash possible eczema. Trial of DesOwen cream and consider derm referral is not improved.  Risk: hx of right breast CA on Arimidex.  Recheck one week and refer if not improved. D/C use of watch for one week to test.

## 2016-07-29 ENCOUNTER — Other Ambulatory Visit: Payer: Self-pay | Admitting: Hematology and Oncology

## 2016-08-06 ENCOUNTER — Ambulatory Visit
Admission: RE | Admit: 2016-08-06 | Discharge: 2016-08-06 | Disposition: A | Discharge status: Home / Self Care | Payer: PRIVATE HEALTH INSURANCE | Source: Ambulatory Visit | Attending: Hematology and Oncology | Admitting: Hematology and Oncology

## 2016-08-06 DIAGNOSIS — Z853 Personal history of malignant neoplasm of breast: Secondary | ICD-10-CM

## 2016-08-06 HISTORY — DX: Personal history of irradiation: Z92.3

## 2016-08-23 ENCOUNTER — Ambulatory Visit (INDEPENDENT_AMBULATORY_CARE_PROVIDER_SITE_OTHER): Payer: PRIVATE HEALTH INSURANCE | Admitting: Internal Medicine

## 2016-08-23 ENCOUNTER — Encounter: Payer: Self-pay | Admitting: Internal Medicine

## 2016-08-23 VITALS — BP 122/84 | HR 65 | Temp 98.8°F | Resp 16 | Wt 150.0 lb

## 2016-08-23 DIAGNOSIS — H0012 Chalazion right lower eyelid: Secondary | ICD-10-CM | POA: Diagnosis not present

## 2016-08-23 MED ORDER — POLYMYXIN B-TRIMETHOPRIM 10000-0.1 UNIT/ML-% OP SOLN: 1.0000 [drp] | OPHTHALMIC | 0 refills | Status: DC

## 2016-08-23 MED FILL — POLYMYXIN B/TMP EYE DROPS: 10000-0.1 | 30 days supply | Qty: 10 | Fill #0

## 2016-08-23 NOTE — Patient Instructions (Signed)
Chalazion A chalazion is a swelling or lump on the eyelid. It can affect the upper or lower eyelid. What are the causes? This condition may be caused by:  Long-lasting (chronic) inflammation of the eyelid glands.  A blocked oil gland in the eyelid.  What are the signs or symptoms? Symptoms of this condition include:  A swelling on the eyelid. The swelling may spread to areas around the eye.  A hard lump on the eyelid. This lump may make it hard to see out of the eye.  How is this diagnosed? This condition is diagnosed with an examination of the eye. How is this treated? This condition is treated by applying a warm compress to the eyelid. If the condition does not improve after two days, it may be treated with:  Surgery.  Medicine that is injected into the chalazion by a health care provider.  Medicine that is applied to the eye.  Follow these instructions at home:  Do not touch the chalazion.  Do not try to remove the pus, such as by squeezing the chalazion or sticking it with a pin or needle.  Do not rub your eyes.  Wash your hands often. Dry your hands with a clean towel.  Keep your face, scalp, and eyebrows clean.  Avoid wearing eye makeup.  Apply a warm, moist compress to the eyelid 4-6 times a day for 10-15 minutes at a time. This will help to open any blocked glands and help to reduce redness and swelling.  Apply over-the-counter and prescription medicines only as told by your health care provider.  If the chalazion does not break open (rupture) on its own in a month, return to your health care provider.  Keep all follow-up appointments as told by your health care provider. This is important. Contact a health care provider if:  Your eyelid has not improved in 4 weeks.  Your eyelid is getting worse.  You have a fever.  The chalazion does not rupture on its own with home treatment in a month. Get help right away if:  You have pain in your eye.  Your  vision changes.  The chalazion becomes painful or red  The chalazion gets bigger. This information is not intended to replace advice given to you by your health care provider. Make sure you discuss any questions you have with your health care provider. Document Released: 03/05/2000 Document Revised: 08/14/2015 Document Reviewed: 07/01/2014 Elsevier Interactive Patient Education  2018 [employer] Inc[employer]   

## 2016-08-23 NOTE — Progress Notes (Signed)
Subjective:    Patient ID: Rebekah Larson, female    DOB: [birth year], 51 y.o.   MRN: [id number]  HPI She is here for an acute visit.   Right lower eye lid infection.  She was camping recently and the conditions were not good and she thinks that caused it.  She typically wears contacts, but stopped wearing them.  Her eyes were a little crusty while camping. She has a red, swollen area in her right lower eye lid.  Her vision is normal, but there is a film in her eye.  She denies eye pain - just irritation.  There is no active discharge.    She started warm compresses last night and thinks it has helped a little.     She leaves to go out of the country in just over one week.   Medications and allergies reviewed with patient and updated if appropriate.  Patient Active Problem List   Diagnosis Date Noted  . Meniscal cyst 06/17/2015  . MVP (mitral valve prolapse) 04/22/2015  . Genetic testing 06/10/2014  . Neoplasm of right breast, primary tumor staging category Tis: ductal carcinoma in situ (DCIS) 05/27/2014  . Osteopenia 10/09/2007    Current Outpatient Prescriptions on File Prior to Visit  Medication Sig Dispense Refill  . anastrozole (ARIMIDEX) 1 MG tablet TAKE 1 TABLET BY MOUTH EVERY DAY 90 tablet 0  . cholecalciferol (VITAMIN D) 1000 UNITS tablet Take 1,000 Units by mouth daily. Reported on 04/22/2015    . desonide (DESOWEN) 0.05 % cream Apply topically 2 (two) times daily. 30 g 0   No current facility-administered medications on file prior to visit.     Past Medical History:  Diagnosis Date  . Breast cancer, right breast ([place]) 05/17/14   DUCTAL CARCINOMA IN SITU WITH CALCIFICATIONS  . History of kidney stones   . Mitral valve prolapse    very mild, per pt.  . Neoplasm of right breast, primary tumor staging category Tis: ductal carcinoma in situ (DCIS) 05/2014  . Personal history of radiation therapy   . S/P radiation therapy 08/05/2014 through 09/16/2014   Right  breast 4800 cGy in 24 sessions, right breast tumor bed boost 1200 cGy in 6 sessions, for a cumulative tumor bed dose of 6000 cGy in 30 sessions    . Shingles     Past Surgical History:  Procedure Laterality Date  . BREAST LUMPECTOMY     right lumpectomy 2016  . BREAST LUMPECTOMY WITH RADIOACTIVE SEED LOCALIZATION Right 07/04/2014   Procedure: BREAST LUMPECTOMY WITH RADIOACTIVE SEED LOCALIZATION;  Surgeon: Rolm Bookbinder, MD;  Location: [facility];  Service: General;  Laterality: Right;  . WISDOM TOOTH EXTRACTION      Social History   Social History  . Marital status: Married    Spouse name: Coralyn Mark  . Number of children: 2  . Years of education: N/A   Social History Main Topics  . Smoking status: Never Smoker  . Smokeless tobacco: Never Used  . Alcohol use Yes     Comment: 1-2 drinks/week  . Drug use: No  . Sexual activity: Yes    Birth control/ protection: Post-menopausal   Other Topics Concern  . Not on file   Social History Narrative   Married. Husband is CEO [employer].       Exercising regularly: walking, elliptical, weights    Family History  Problem Relation Age of Onset  . Colon cancer Father 11  . CAD Father  11       CABG x 2 - age 84s, 73s  . Hypertension Father   . Hyperlipidemia Father   . Breast cancer Mother 58       DCIS  . Cancer Paternal Grandfather 50       spinal tumor  . Breast cancer Other        MGM's sister dx over 9  . Breast cancer Other        MGF's sister dx over 41  . Breast cancer Sister   . CAD Paternal Aunt     Review of Systems  Constitutional: Negative for fever.  Eyes: Positive for redness (lower eye lid - on right). Negative for photophobia, pain, discharge and visual disturbance.       Objective:   Vitals:   08/23/16 1138  BP: 122/84  Pulse: 65  Resp: 16  Temp: 98.8 F (37.1 C)   Filed Weights   08/23/16 1138  Weight: 150 lb (68 kg)   Body mass index is 22.81  kg/m.  Wt Readings from Last 3 Encounters:  08/23/16 150 lb (68 kg)  02/19/16 151 lb 12.8 oz (68.9 kg)  07/22/15 152 lb 9.6 oz (69.2 kg)     Physical Exam  Constitutional: She appears well-developed and well-nourished. No distress.  HENT:  Head: Normocephalic and atraumatic.  Eyes: Conjunctivae and EOM are normal. Right eye exhibits no discharge. Left eye exhibits no discharge. No scleral icterus.  Focal area of redness and swelling right bottom eye lid  Skin: Skin is warm and dry. She is not diaphoretic.          Assessment & Plan:   See Problem List for Assessment and Plan of chronic medical problems.

## 2016-08-23 NOTE — Assessment & Plan Note (Signed)
Continue warm compresses Will start an antibiotic eye drop - may not be necessary, which we discussed but want to make sure this is cleared up prior to her traveling out of the country.   Call if no improvement

## 2016-09-14 ENCOUNTER — Encounter: Payer: Self-pay | Admitting: Internal Medicine

## 2016-09-14 ENCOUNTER — Ambulatory Visit (INDEPENDENT_AMBULATORY_CARE_PROVIDER_SITE_OTHER): Payer: PRIVATE HEALTH INSURANCE | Admitting: Internal Medicine

## 2016-09-14 VITALS — BP 122/80 | HR 62 | Temp 98.2°F | Resp 16 | Ht 68.0 in | Wt 151.0 lb

## 2016-09-14 DIAGNOSIS — M8580 Other specified disorders of bone density and structure, unspecified site: Secondary | ICD-10-CM | POA: Diagnosis not present

## 2016-09-14 DIAGNOSIS — Z0000 Encounter for general adult medical examination without abnormal findings: Secondary | ICD-10-CM

## 2016-09-14 MED FILL — ONDANSETRON HCL 4 MG TABLET: 4 | 3 days supply | Qty: 20 | Fill #0

## 2016-09-14 MED FILL — oxyCODONE HCL 5 MG TABS: 5 | 2 days supply | Qty: 10 | Fill #0

## 2016-09-14 NOTE — Progress Notes (Signed)
Subjective:    Patient ID: Rebekah Larson, female    DOB: Aug 20, 1965, 51 y.o.   MRN: [id number]  HPI She is here for a physical exam.   Kidney stones:  She is having her third episode of kidney stones.   She has seen neurology and is currently taking Tylenol, ibuprofen and Flomax. She has hydrocodone as needed. She currently does not have pain. Most likely she will be able to pass the stone. She denies any urinary symptoms today.  She has no concerns overall. She may have a mild cold, but feels it is just a viral cold. She will be leaving soon to travel to [place] to visit family.  Medications and allergies reviewed with patient and updated if appropriate.  Patient Active Problem List   Diagnosis Date Noted  . Chalazion of right lower eyelid 08/23/2016  . Meniscal cyst 06/17/2015  . MVP (mitral valve prolapse) 04/22/2015  . Genetic testing 06/10/2014  . Neoplasm of right breast, primary tumor staging category Tis: ductal carcinoma in situ (DCIS) 05/27/2014  . Osteopenia 10/09/2007    Current Outpatient Prescriptions on File Prior to Visit  Medication Sig Dispense Refill  . anastrozole (ARIMIDEX) 1 MG tablet TAKE 1 TABLET BY MOUTH EVERY DAY 90 tablet 0  . cholecalciferol (VITAMIN D) 1000 UNITS tablet Take 1,000 Units by mouth daily. Reported on 04/22/2015    . desonide (DESOWEN) 0.05 % cream Apply topically 2 (two) times daily. 30 g 0  . trimethoprim-polymyxin b (POLYTRIM) ophthalmic solution Place 1 drop into the right eye every 4 (four) hours. 10 mL 0   No current facility-administered medications on file prior to visit.     Past Medical History:  Diagnosis Date  . Breast cancer, right breast ([place]) 05/17/14   DUCTAL CARCINOMA IN SITU WITH CALCIFICATIONS  . History of kidney stones   . Mitral valve prolapse    very mild, per pt.  . Neoplasm of right breast, primary tumor staging category Tis: ductal carcinoma in situ (DCIS) 05/2014  . Personal history of radiation therapy    . S/P radiation therapy 08/05/2014 through 09/16/2014   Right breast 4800 cGy in 24 sessions, right breast tumor bed boost 1200 cGy in 6 sessions, for a cumulative tumor bed dose of 6000 cGy in 30 sessions    . Shingles     Past Surgical History:  Procedure Laterality Date  . BREAST LUMPECTOMY     right lumpectomy 2016  . BREAST LUMPECTOMY WITH RADIOACTIVE SEED LOCALIZATION Right 07/04/2014   Procedure: BREAST LUMPECTOMY WITH RADIOACTIVE SEED LOCALIZATION;  Surgeon: Rolm Bookbinder, MD;  Location: [facility];  Service: General;  Laterality: Right;  . WISDOM TOOTH EXTRACTION      Social History   Social History  . Marital status: Married    Spouse name: Coralyn Mark  . Number of children: 2  . Years of education: N/A   Social History Main Topics  . Smoking status: Never Smoker  . Smokeless tobacco: Never Used  . Alcohol use Yes     Comment: 1-2 drinks/week  . Drug use: No  . Sexual activity: Yes    Birth control/ protection: Post-menopausal   Other Topics Concern  . Not on file   Social History Narrative   Married. Husband is CEO [employer].       Exercising regularly: walking, elliptical, weights    Family History  Problem Relation Age of Onset  . Colon cancer Father 42  . CAD  Father 69       CABG x 2 - age 70s, 65s  . Hypertension Father   . Hyperlipidemia Father   . Breast cancer Mother 63       DCIS  . Cancer Paternal Grandfather 50       spinal tumor  . Breast cancer Other        MGM's sister dx over 104  . Breast cancer Other        MGF's sister dx over 59  . Breast cancer Sister   . CAD Paternal Aunt     Review of Systems  Constitutional: Negative for appetite change, chills, fatigue and fever.  HENT: Positive for congestion. Negative for sinus pain.   Eyes: Negative for visual disturbance.  Respiratory: Positive for cough (has mild URI). Negative for shortness of breath and wheezing.   Cardiovascular:  Negative for chest pain and palpitations.  Gastrointestinal: Negative for abdominal pain, blood in stool, constipation, diarrhea and nausea.       GERD once a week  Genitourinary: Negative for dysuria and hematuria.  Musculoskeletal: Positive for arthralgias.  Skin: Negative for color change and rash.  Neurological: Negative for light-headedness and headaches.  Psychiatric/Behavioral: Negative for dysphoric mood.       Objective:   Vitals:   09/14/16 1516  BP: 122/80  Pulse: 62  Resp: 16  Temp: 98.2 F (36.8 C)   Filed Weights   09/14/16 1516  Weight: 151 lb (68.5 kg)   Body mass index is 22.96 kg/m.  Wt Readings from Last 3 Encounters:  09/14/16 151 lb (68.5 kg)  08/23/16 150 lb (68 kg)  02/19/16 151 lb 12.8 oz (68.9 kg)     Physical Exam Constitutional: She appears well-developed and well-nourished. No distress.  HENT:  Head: Normocephalic and atraumatic.  Right Ear: External ear normal. Normal ear canal and TM Left Ear: External ear normal.  Normal ear canal and TM Mouth/Throat: Oropharynx is clear and moist.  Eyes: Conjunctivae and EOM are normal.  Neck: Neck supple. No tracheal deviation present. No thyromegaly present.  No carotid bruit  Cardiovascular: Normal rate, regular rhythm and normal heart sounds.   No murmur heard.  No edema. Pulmonary/Chest: Effort normal and breath sounds normal. No respiratory distress. She has no wheezes. She has no rales.  Breast: deferred to Gyn Abdominal: Soft. She exhibits no distension. There is no tenderness.  Lymphadenopathy: She has no cervical adenopathy.  Skin: Skin is warm and dry. She is not diaphoretic.  Psychiatric: She has a normal mood and affect. Her behavior is normal.         Assessment & Plan:   Physical exam: Screening blood work  Done by her gynecologist Immunizations   Up to date  Colonoscopy  -  Up-to-date-done by Dr Collene Mares and was normal - 2017 Mammogram   Up to date  Gyn  Up to date  Dexa       Up-to-date through GYN Eye exams   Up-to-date EKG  Last done 2012 -  Not indicated today, no concerning symptoms Exercise  -  regular Weight   Normal BMI Skin  -   No concerning moles/freckles Substance abuse - none  See Problem List for Assessment and Plan of chronic medical problems.   FU annually

## 2016-09-14 NOTE — Assessment & Plan Note (Signed)
Monitored by GYN DEXA up-to-date Taking vitamin D Exercising regularly

## 2016-09-14 NOTE — Patient Instructions (Addendum)
All other Health Maintenance issues reviewed.   All recommended immunizations and age-appropriate screenings are up-to-date or discussed.  No immunizations administered today.   Medications reviewed and updated.  No changes recommended at this time.   Please followup in one year for a physical, sooner if needed   Health Maintenance, Female Adopting a healthy lifestyle and getting preventive care can go a long way to promote health and wellness. Talk with your health care provider about what schedule of regular examinations is right for you. This is a good chance for you to check in with your provider about disease prevention and staying healthy. In between checkups, there are plenty of things you can do on your own. Experts have done a lot of research about which lifestyle changes and preventive measures are most likely to keep you healthy. Ask your health care provider for more information. Weight and diet Eat a healthy diet  Be sure to include plenty of vegetables, fruits, low-fat dairy products, and lean protein.  Do not eat a lot of foods high in solid fats, added sugars, or salt.  Get regular exercise. This is one of the most important things you can do for your health. ? Most adults should exercise for at least 150 minutes each week. The exercise should increase your heart rate and make you sweat (moderate-intensity exercise). ? Most adults should also do strengthening exercises at least twice a week. This is in addition to the moderate-intensity exercise.  Maintain a healthy weight  Body mass index (BMI) is a measurement that can be used to identify possible weight problems. It estimates body fat based on height and weight. Your health care provider can help determine your BMI and help you achieve or maintain a healthy weight.  For females 48 years of age and older: ? A BMI below 18.5 is considered underweight. ? A BMI of 18.5 to 24.9 is normal. ? A BMI of 25 to 29.9 is  considered overweight. ? A BMI of 30 and above is considered obese.  Watch levels of cholesterol and blood lipids  You should start having your blood tested for lipids and cholesterol at 53 years of age, then have this test every 5 years.  You may need to have your cholesterol levels checked more often if: ? Your lipid or cholesterol levels are high. ? You are older than 53 years of age. ? You are at high risk for heart disease.  Cancer screening Lung Cancer  Lung cancer screening is recommended for adults 14-63 years old who are at high risk for lung cancer because of a history of smoking.  A yearly low-dose CT scan of the lungs is recommended for people who: ? Currently smoke. ? Have quit within the past 15 years. ? Have at least a 30-pack-year history of smoking. A pack year is smoking an average of one pack of cigarettes a day for 1 year.  Yearly screening should continue until it has been 15 years since you quit.  Yearly screening should stop if you develop a health problem that would prevent you from having lung cancer treatment.  Breast Cancer  Practice breast self-awareness. This means understanding how your breasts normally appear and feel.  It also means doing regular breast self-exams. Let your health care provider know about any changes, no matter how small.  If you are in your 20s or 30s, you should have a clinical breast exam (CBE) by a health care provider every 1-3 years as part  of a regular health exam.  If you are 70 or older, have a CBE every year. Also consider having a breast X-ray (mammogram) every year.  If you have a family history of breast cancer, talk to your health care provider about genetic screening.  If you are at high risk for breast cancer, talk to your health care provider about having an MRI and a mammogram every year.  Breast cancer gene (BRCA) assessment is recommended for women who have family members with BRCA-related cancers.  BRCA-related cancers include: ? Breast. ? Ovarian. ? Tubal. ? Peritoneal cancers.  Results of the assessment will determine the need for genetic counseling and BRCA1 and BRCA2 testing.  Cervical Cancer Your health care provider may recommend that you be screened regularly for cancer of the pelvic organs (ovaries, uterus, and vagina). This screening involves a pelvic examination, including checking for microscopic changes to the surface of your cervix (Pap test). You may be encouraged to have this screening done every 3 years, beginning at age 68.  For women ages 4-65, health care providers may recommend pelvic exams and Pap testing every 3 years, or they may recommend the Pap and pelvic exam, combined with testing for human papilloma virus (HPV), every 5 years. Some types of HPV increase your risk of cervical cancer. Testing for HPV may also be done on women of any age with unclear Pap test results.  Other health care providers may not recommend any screening for nonpregnant women who are considered low risk for pelvic cancer and who do not have symptoms. Ask your health care provider if a screening pelvic exam is right for you.  If you have had past treatment for cervical cancer or a condition that could lead to cancer, you need Pap tests and screening for cancer for at least 20 years after your treatment. If Pap tests have been discontinued, your risk factors (such as having a new sexual partner) need to be reassessed to determine if screening should resume. Some women have medical problems that increase the chance of getting cervical cancer. In these cases, your health care provider may recommend more frequent screening and Pap tests.  Colorectal Cancer  This type of cancer can be detected and often prevented.  Routine colorectal cancer screening usually begins at 53 years of age and continues through 53 years of age.  Your health care provider may recommend screening at an earlier age if  you have risk factors for colon cancer.  Your health care provider may also recommend using home test kits to check for hidden blood in the stool.  A small camera at the end of a tube can be used to examine your colon directly (sigmoidoscopy or colonoscopy). This is done to check for the earliest forms of colorectal cancer.  Routine screening usually begins at age 55.  Direct examination of the colon should be repeated every 5-10 years through 53 years of age. However, you may need to be screened more often if early forms of precancerous polyps or small growths are found.  Skin Cancer  Check your skin from head to toe regularly.  Tell your health care provider about any new moles or changes in moles, especially if there is a change in a mole's shape or color.  Also tell your health care provider if you have a mole that is larger than the size of a pencil eraser.  Always use sunscreen. Apply sunscreen liberally and repeatedly throughout the day.  Protect yourself by wearing long  sleeves, pants, a wide-brimmed hat, and sunglasses whenever you are outside.  Heart disease, diabetes, and high blood pressure  High blood pressure causes heart disease and increases the risk of stroke. High blood pressure is more likely to develop in: ? People who have blood pressure in the high end of the normal range (130-139/85-89 mm Hg). ? People who are overweight or obese. ? People who are African American.  If you are 25-24 years of age, have your blood pressure checked every 3-5 years. If you are 80 years of age or older, have your blood pressure checked every year. You should have your blood pressure measured twice-once when you are at a hospital or clinic, and once when you are not at a hospital or clinic. Record the average of the two measurements. To check your blood pressure when you are not at a hospital or clinic, you can use: ? An automated blood pressure machine at a pharmacy. ? A home blood  pressure monitor.  If you are between 24 years and 60 years old, ask your health care provider if you should take aspirin to prevent strokes.  Have regular diabetes screenings. This involves taking a blood sample to check your fasting blood sugar level. ? If you are at a normal weight and have a low risk for diabetes, have this test once every three years after 53 years of age. ? If you are overweight and have a high risk for diabetes, consider being tested at a younger age or more often. Preventing infection Hepatitis B  If you have a higher risk for hepatitis B, you should be screened for this virus. You are considered at high risk for hepatitis B if: ? You were born in a country where hepatitis B is common. Ask your health care provider which countries are considered high risk. ? Your parents were born in a high-risk country, and you have not been immunized against hepatitis B (hepatitis B vaccine). ? You have HIV or AIDS. ? You use needles to inject street drugs. ? You live with someone who has hepatitis B. ? You have had sex with someone who has hepatitis B. ? You get hemodialysis treatment. ? You take certain medicines for conditions, including cancer, organ transplantation, and autoimmune conditions.  Hepatitis C  Blood testing is recommended for: ? Everyone born from [birth year] through 1967. ? Anyone with known risk factors for hepatitis C.  Sexually transmitted infections (STIs)  You should be screened for sexually transmitted infections (STIs) including gonorrhea and chlamydia if: ? You are sexually active and are younger than 52 years of age. ? You are older than 53 years of age and your health care provider tells you that you are at risk for this type of infection. ? Your sexual activity has changed since you were last screened and you are at an increased risk for chlamydia or gonorrhea. Ask your health care provider if you are at risk.  If you do not have HIV, but are at risk,  it may be recommended that you take a prescription medicine daily to prevent HIV infection. This is called pre-exposure prophylaxis (PrEP). You are considered at risk if: ? You are sexually active and do not regularly use condoms or know the HIV status of your partner(s). ? You take drugs by injection. ? You are sexually active with a partner who has HIV.  Talk with your health care provider about whether you are at high risk of being infected with HIV. If  you choose to begin PrEP, you should first be tested for HIV. You should then be tested every 3 months for as long as you are taking PrEP. Pregnancy  If you are premenopausal and you may become pregnant, ask your health care provider about preconception counseling.  If you may become pregnant, take 400 to 800 micrograms (mcg) of folic acid every day.  If you want to prevent pregnancy, talk to your health care provider about birth control (contraception). Osteoporosis and menopause  Osteoporosis is a disease in which the bones lose minerals and strength with aging. This can result in serious bone fractures. Your risk for osteoporosis can be identified using a bone density scan.  If you are 31 years of age or older, or if you are at risk for osteoporosis and fractures, ask your health care provider if you should be screened.  Ask your health care provider whether you should take a calcium or vitamin D supplement to lower your risk for osteoporosis.  Menopause may have certain physical symptoms and risks.  Hormone replacement therapy may reduce some of these symptoms and risks. Talk to your health care provider about whether hormone replacement therapy is right for you. Follow these instructions at home:  Schedule regular health, dental, and eye exams.  Stay current with your immunizations.  Do not use any tobacco products including cigarettes, chewing tobacco, or electronic cigarettes.  If you are pregnant, do not drink  alcohol.  If you are breastfeeding, limit how much and how often you drink alcohol.  Limit alcohol intake to no more than 1 drink per day for nonpregnant women. One drink equals 12 ounces of beer, 5 ounces of wine, or 1 ounces of hard liquor.  Do not use street drugs.  Do not share needles.  Ask your health care provider for help if you need support or information about quitting drugs.  Tell your health care provider if you often feel depressed.  Tell your health care provider if you have ever been abused or do not feel safe at home. This information is not intended to replace advice given to you by your health care provider. Make sure you discuss any questions you have with your health care provider. Document Released: 09/21/2010 Document Revised: 08/14/2015 Document Reviewed: 12/10/2014 Elsevier Interactive Patient Education  [employer].

## 2017-01-13 ENCOUNTER — Ambulatory Visit (INDEPENDENT_AMBULATORY_CARE_PROVIDER_SITE_OTHER): Payer: PRIVATE HEALTH INSURANCE | Admitting: Family Medicine

## 2017-01-13 ENCOUNTER — Ambulatory Visit: Payer: Self-pay

## 2017-01-13 ENCOUNTER — Encounter: Payer: Self-pay | Admitting: Family Medicine

## 2017-01-13 ENCOUNTER — Ambulatory Visit (INDEPENDENT_AMBULATORY_CARE_PROVIDER_SITE_OTHER)
Admission: RE | Admit: 2017-01-13 | Discharge: 2017-01-13 | Disposition: A | Discharge status: Home / Self Care | Payer: PRIVATE HEALTH INSURANCE | Source: Ambulatory Visit | Attending: Family Medicine | Admitting: Family Medicine

## 2017-01-13 VITALS — BP 104/80 | HR 67 | Ht 68.0 in | Wt 148.0 lb

## 2017-01-13 DIAGNOSIS — G8929 Other chronic pain: Secondary | ICD-10-CM

## 2017-01-13 DIAGNOSIS — M25561 Pain in right knee: Secondary | ICD-10-CM | POA: Diagnosis not present

## 2017-01-13 DIAGNOSIS — S83241A Other tear of medial meniscus, current injury, right knee, initial encounter: Secondary | ICD-10-CM | POA: Diagnosis not present

## 2017-01-13 MED ORDER — MELOXICAM 15 MG PO TABS: 15.0000 mg | ORAL_TABLET | Freq: Every day | ORAL | 0 refills | Status: DC

## 2017-01-13 MED ORDER — TRAMADOL HCL 50 MG PO TABS: 50.0000 mg | ORAL_TABLET | Freq: Three times a day (TID) | ORAL | 0 refills | Status: DC | PRN

## 2017-01-13 MED FILL — traMADol HCL 50 MG TABS: 50 | 10 days supply | Qty: 30 | Fill #0

## 2017-01-13 MED FILL — MELOXICAM 15 MG TABLET: 15 | 30 days supply | Qty: 30 | Fill #0

## 2017-01-13 NOTE — Assessment & Plan Note (Signed)
Knee is internal derangement patient does have locking. Patient does have significant antalgic problems as well. Patient does have a hemarthrosis. Patient likely will have a possible anterior cruciate ligament tear and already has a meniscal tear. I feel that this could be a surgical problem. Patient at this point will be given tramadol as well as meloxicam. X-ray pending an MRI ordered.

## 2017-01-13 NOTE — Progress Notes (Signed)
[facility] [place], [place] [address] Phone: [phone] Subjective:    I'm seeing this patient by the request  of:    CC: Right knee pain  VPX:[id number]  Rebekah Larson is a 51 y.o. female coming in with complaint of right knee pain. Was walking her dog when she got medial knee pain. Her knee is swollen. She says she has limited ROM. She feels like her knee is not improving. She gets stiffness in her knee when she is not moving. She says that initially she feels she lost some quad strength.  Onset- 2 weeks Location- Medial knee, pes anserine Duration-  Character- Sharp Aggravating factors- Stairs, walking is awkward, twisting  Reliving factors- Icing, antiinflammatory  Therapies tried- compression sleeve at night, oral medications, ice  Severity- 6/10 at its worse      Past Medical History:  Diagnosis Date  . Breast cancer, right breast ([place]) 05/17/14   DUCTAL CARCINOMA IN SITU WITH CALCIFICATIONS  . History of kidney stones   . Mitral valve prolapse    very mild, per pt.  . Neoplasm of right breast, primary tumor staging category Tis: ductal carcinoma in situ (DCIS) 05/2014  . Personal history of radiation therapy   . S/P radiation therapy 08/05/2014 through 09/16/2014   Right breast 4800 cGy in 24 sessions, right breast tumor bed boost 1200 cGy in 6 sessions, for a cumulative tumor bed dose of 6000 cGy in 30 sessions    . Shingles    Past Surgical History:  Procedure Laterality Date  . BREAST LUMPECTOMY     right lumpectomy 2016  . BREAST LUMPECTOMY WITH RADIOACTIVE SEED LOCALIZATION Right 07/04/2014   Procedure: BREAST LUMPECTOMY WITH RADIOACTIVE SEED LOCALIZATION;  Surgeon: Rolm Bookbinder, MD;  Location: [facility];  Service: General;  Laterality: Right;  . WISDOM TOOTH EXTRACTION     Social History   Social History  . Marital status: Married    Spouse name: Coralyn Mark  . Number of  children: 2  . Years of education: N/A   Social History Main Topics  . Smoking status: Never Smoker  . Smokeless tobacco: Never Used  . Alcohol use Yes     Comment: 1-2 drinks/week  . Drug use: No  . Sexual activity: Yes    Birth control/ protection: Post-menopausal   Other Topics Concern  . Not on file   Social History Narrative   Married. Husband is CEO [employer].       Exercising regularly: walking, elliptical, weights, biking - [place], yoga   No Known Allergies Family History  Problem Relation Age of Onset  . Colon cancer Father 73  . CAD Father 69       CABG x 2 - age 78s, 25s  . Hypertension Father   . Hyperlipidemia Father   . Breast cancer Mother 2       DCIS  . Hypertension Mother   . Cancer Paternal Grandfather 50       spinal tumor  . Breast cancer Other        MGM's sister dx over 48  . Breast cancer Other        MGF's sister dx over 26  . Breast cancer Sister   . CAD Paternal Aunt      Past medical history, social, surgical and family history all reviewed in electronic medical record.  No pertanent information unless stated regarding to the chief complaint.   Review  of Systems:Review of systems updated and as accurate as of 01/13/17  No headache, visual changes, nausea, vomiting, diarrhea, constipation, dizziness, abdominal pain, skin rash, fevers, chills, night sweats, weight loss, swollen lymph nodes, body aches,  chest pain, shortness of breath, mood changes. Positive muscle aches and joint swelling  Objective  Blood pressure 104/80, pulse 67, height 5\' 8"  (1.727 m), weight 148 lb (67.1 kg), last menstrual period 07/10/2009, SpO2 98 %. Systems examined below as of 01/13/17   General: No apparent distress alert and oriented x3 mood and affect normal, dressed appropriately.  HEENT: Pupils equal, extraocular movements intact  Respiratory: Patient's speak in full sentences and does not appear short of breath  Cardiovascular: No lower  extremity edema, non tender, no erythema  Skin: Warm dry intact with no signs of infection or rash on extremities or on axial skeleton.  Abdomen: Soft nontender  Neuro: Cranial nerves II through XII are intact, neurovascularly intact in all extremities with 2+ DTRs and 2+ pulses.  Lymph: No lymphadenopathy of posterior or anterior cervical chain or axillae bilaterally.  Gait severely antalgic gait MSK:  Non tender with full range of motion and good stability and symmetric strength and tone of shoulders, elbows, wrist, hip, and ankles bilaterally.  Knee: Right Severe effusion Diffuse tenderness.  ROM 95 degrees of flexion and lacks last 10 degrees of extension  Patient does have some laxity over the MCL and the anterior cruciate ligament. His point though noted Positive Mcmurray's, Apley's, and Thessalonian tests. painful patellar compression. Patellar and quadriceps tendons unremarkable. Hamstring and quadriceps strength is normal.   MSK US performed of: Right This study was ordered, performed, and interpreted by Charlann Boxer D.O.  Knee: Severe effusion . She more of a hemarthrosis with the heterotropic changes of the effusion. It is compressible. Patient does have a very large medial meniscal tear that is displaced with hypoechoic changes and increasing Doppler flow. Mild injury to the MCL.  IMPRESSION: Knee effusion with meniscal tear   Impression and Recommendations:     This case required medical decision making of moderate complexity.      Note: This dictation was prepared with Dragon dictation along with smaller phrase technology. Any transcriptional errors that result from this process are unintentional.

## 2017-01-13 NOTE — Patient Instructions (Signed)
Good to see you  Rebekah Larson is your friend. Ice 20 minutes 2 times daily. Usually after activity and before bed. Wear brace daily not at night Tramadol at night for pain  Meloxicam daily  Xray today  MRI ordered.  Lets see what we find and we will discuss.

## 2017-01-14 ENCOUNTER — Ambulatory Visit
Admission: RE | Admit: 2017-01-14 | Discharge: 2017-01-14 | Disposition: A | Discharge status: Home / Self Care | Payer: PRIVATE HEALTH INSURANCE | Source: Ambulatory Visit | Attending: Family Medicine | Admitting: Family Medicine

## 2017-01-14 DIAGNOSIS — M25561 Pain in right knee: Principal | ICD-10-CM

## 2017-01-14 DIAGNOSIS — G8929 Other chronic pain: Secondary | ICD-10-CM

## 2017-01-25 NOTE — Progress Notes (Signed)
[facility] [place], [place] [address] Phone: [phone] Subjective:     CC: Knee pain follow-up right   ZWC:[id number]  Rebekah Larson is a 51 y.o. female coming in with complaint of knee pain.  Patient was seen.  Significant knee effusion as well as some instability.  Was sent for an MRI of the knee. Found to have severe OA. Sprain of ACL.   Patient states still having discomfort and pain.  Not able to walk quite regularly.  Looking for some type of a resolving of pain to allow her to potentially go on her trip.  If anything worsening symptoms.      Past Medical History:  Diagnosis Date  . Breast cancer, right breast ([place]) 05/17/14   DUCTAL CARCINOMA IN SITU WITH CALCIFICATIONS  . History of kidney stones   . Mitral valve prolapse    very mild, per pt.  . Neoplasm of right breast, primary tumor staging category Tis: ductal carcinoma in situ (DCIS) 05/2014  . Personal history of radiation therapy   . S/P radiation therapy 08/05/2014 through 09/16/2014   Right breast 4800 cGy in 24 sessions, right breast tumor bed boost 1200 cGy in 6 sessions, for a cumulative tumor bed dose of 6000 cGy in 30 sessions    . Shingles    Past Surgical History:  Procedure Laterality Date  . BREAST LUMPECTOMY     right lumpectomy 2016  . WISDOM TOOTH EXTRACTION     Social History   Socioeconomic History  . Marital status: Married    Spouse name: Coralyn Mark  . Number of children: 2  . Years of education: Not on file  . Highest education level: Not on file  Social Needs  . Financial resource strain: Not on file  . Food insecurity - worry: Not on file  . Food insecurity - inability: Not on file  . Transportation needs - medical: Not on file  . Transportation needs - non-medical: Not on file  Occupational History  . Not on file  Tobacco Use  . Smoking status: Never Smoker  . Smokeless tobacco: Never Used  Substance and Sexual  Activity  . Alcohol use: Yes    Comment: 1-2 drinks/week  . Drug use: No  . Sexual activity: Yes    Birth control/protection: Post-menopausal  Other Topics Concern  . Not on file  Social History Narrative   Married. Husband is CEO [employer].       Exercising regularly: walking, elliptical, weights, biking - [place], yoga   No Known Allergies Family History  Problem Relation Age of Onset  . Colon cancer Father 6  . CAD Father 47       CABG x 2 - age 69s, 20s  . Hypertension Father   . Hyperlipidemia Father   . Breast cancer Mother 68       DCIS  . Hypertension Mother   . Cancer Paternal Grandfather 50       spinal tumor  . Breast cancer Other        MGM's sister dx over 47  . Breast cancer Other        MGF's sister dx over 21  . Breast cancer Sister   . CAD Paternal Aunt      Past medical history, social, surgical and family history all reviewed in electronic medical record.  No pertanent information unless stated regarding to the chief complaint.   Review of Systems:Review of systems  updated and as accurate as of 01/25/17  No headache, visual changes, nausea, vomiting, diarrhea, constipation, dizziness, abdominal pain, skin rash, fevers, chills, night sweats, weight loss, swollen lymph nodes, body aches, joint swelling, muscle aches, chest pain, shortness of breath, mood changes.   Objective  Last menstrual period 07/10/2009. Systems examined below as of 01/25/17   General: No apparent distress alert and oriented x3 mood and affect normal, dressed appropriately.  HEENT: Pupils equal, extraocular movements intact  Respiratory: Patient's speak in full sentences and does not appear short of breath  Cardiovascular: No lower extremity edema, non tender, no erythema  Skin: Warm dry intact with no signs of infection or rash on extremities or on axial skeleton.  Abdomen: Soft nontender  Neuro: Cranial nerves II through XII are intact, neurovascularly intact in all  extremities with 2+ DTRs and 2+ pulses.  Lymph: No lymphadenopathy of posterior or anterior cervical chain or axillae bilaterally.  Gait normal with good balance and coordination.  MSK:  Non tender with full range of motion and good stability and symmetric strength and tone of shoulders, elbows, wrist, hip, and ankles bilaterally.  Knee: right valgus deformity noted.  Abnormal thigh to calf ratio.  Tender to palpation over medial and PF joint line.  Still lacking the last 5 degrees of extension. instability with valgus force.  painful patellar compression. Patellar glide with mild crepitus. Patellar and quadriceps tendons unremarkable. Hamstring and quadriceps strength is normal. Contralateral knee shows no arthritic changes and no pain  After informed written and verbal consent, patient was seated on exam table. Right knee was prepped with alcohol swab and utilizing anterolateral approach, patient's right knee space was injected with 4:1  marcaine 0.5%: Kenalog 40mg /dL. Patient tolerated the procedure well without immediate complications.   Impression and Recommendations:     This case required medical decision making of moderate complexity.      Note: This dictation was prepared with Dragon dictation along with smaller phrase technology. Any transcriptional errors that result from this process are unintentional.

## 2017-01-26 ENCOUNTER — Ambulatory Visit: Payer: PRIVATE HEALTH INSURANCE | Admitting: Family Medicine

## 2017-01-26 ENCOUNTER — Encounter: Payer: Self-pay | Admitting: Family Medicine

## 2017-01-26 DIAGNOSIS — M1711 Unilateral primary osteoarthritis, right knee: Secondary | ICD-10-CM

## 2017-01-26 NOTE — Patient Instructions (Addendum)
Good to see you  Rebekah Larson is your friend.  Wear brace with a lot of activity  pennsaid pinkie amount topically 2 times daily as needed.   Continue the vitamins See me again in 4 weeks.

## 2017-01-26 NOTE — Assessment & Plan Note (Addendum)
Patient does have more of a degenerative arthritis of the knee.  Patient given injection and tolerated the procedure well.  We discussed icing regimen and home exercises.  We discussed which activities to do which wants to avoid.  Patient will come back and see me again in 4 weeks.  Could be a candidate for Visco supplementation as well as a custom brace secondary to an abnormal thigh to Ratio.

## 2017-02-17 ENCOUNTER — Ambulatory Visit (HOSPITAL_BASED_OUTPATIENT_CLINIC_OR_DEPARTMENT_OTHER): Payer: PRIVATE HEALTH INSURANCE | Admitting: Hematology and Oncology

## 2017-02-17 DIAGNOSIS — D0511 Intraductal carcinoma in situ of right breast: Secondary | ICD-10-CM

## 2017-02-17 NOTE — Assessment & Plan Note (Signed)
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 She has mild-mod myalgias  Breast Cancer Surveillance: 1. Breast exam  02/17/2017: Normal 2. Mammogram 08/06/2016 No abnormalities. Postsurgical changes. Breast Density Category C.  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

## 2017-02-17 NOTE — Progress Notes (Signed)
Patient Care Team: Binnie Rail, MD as PCP - General (Internal Medicine) Rolm Bookbinder, MD (General Surgery) Dian Queen, MD (Obstetrics and Gynecology) Nicholas Lose, MD (Hematology and Oncology) Arloa Koh, MD as Consulting Physician (Radiation Oncology) Sylvan Cheese, NP as Nurse Practitioner (Hematology and Oncology)  DIAGNOSIS:  Encounter Diagnosis  Name Primary?  . Neoplasm of right breast, primary tumor staging category Tis: ductal carcinoma in situ (DCIS)     SUMMARY OF ONCOLOGIC HISTORY:   Neoplasm of right breast, primary tumor staging category Tis: ductal carcinoma in situ (DCIS)   04/16/2014 Mammogram    Right breast: loose group of calcifications measuring 10 x 5 x 12 mm, slightly coarse and slightly heterogeneous within posterior upper breast      04/27/2014 Breast MRI    No suspicious areas of non mass enhancement identified within either the right or left breast.      05/17/2014 Initial Biopsy    Right breast core needle bx: DCIS with calcifications, intermediate to high grade, ER+ (98%), PR+ (2%).      05/17/2014 Clinical Stage    Stage 0: Tis N0      05/27/2014 Procedure    Genetic testing: BRCA Plus ([facility]) panel reveals no clinically significant variant at BRCA1, BRCA2, CDH1, PALB2, PTEN, and TP53.      07/04/2014 Definitive Surgery    Right breast lumpectomy: DCIS, intermediate grade with necrosis and calcifications, spanning 0.6 cm, less than 0.1 cm to posterior and superior margin, ER+ (98%), PR+ (2%)      07/04/2014 Pathologic Stage    Stage 0: pTis pN0      08/05/2014 - 09/16/2014 Radiation Therapy    Adjuvant RT Valere Dross): Right breast 48 Gy over 24 fractions; right breast tumor bed boost 12 Gy over 6 fractions; total dose 60 Gy         12/02/2014 -  Anti-estrogen oral therapy    Anastrozole 1 mg daily. Planned duration of therapy 5 years.      02/07/2015 Survivorship    Survivorship care plan completed  and mailed to patient.       CHIEF COMPLIANT: Severe right knee pain  INTERVAL HISTORY: Rebekah Larson is a 51 year old with above-mentioned history of DCIS right breast status post lumpectomy and radiation is currently on anastrozole therapy.  For the past couple of years she has had increasing right knee pain.  Recently she had a knee MRI which showed moderate to severe arthritis.  She also had lack of meniscus and a small tear as well.  She denies any lumps or nodules in the breast.  REVIEW OF SYSTEMS:   Constitutional: Denies fevers, chills or abnormal weight loss Eyes: Denies blurriness of vision Ears, nose, mouth, throat, and face: Denies mucositis or sore throat Respiratory: Denies cough, dyspnea or wheezes Cardiovascular: Denies palpitation, chest discomfort Gastrointestinal:  Denies nausea, heartburn or change in bowel habits Skin: Denies abnormal skin rashes Lymphatics: Denies new lymphadenopathy or easy bruising Neurological:Denies numbness, tingling or new weaknesses Behavioral/Psych: Mood is stable, no new changes  Extremities: Right knee pain related to arthritis Breast:  denies any pain or lumps or nodules in either breasts All other systems were reviewed with the patient and are negative.  I have reviewed the past medical history, past surgical history, social history and family history with the patient and they are unchanged from previous note.  ALLERGIES:  has No Known Allergies.  MEDICATIONS:  Current Outpatient Medications  Medication Sig Dispense Refill  . anastrozole (  ARIMIDEX) 1 MG tablet TAKE 1 TABLET BY MOUTH EVERY DAY 90 tablet 0  . cholecalciferol (VITAMIN D) 1000 UNITS tablet Take 1,000 Units by mouth daily. Reported on 04/22/2015    . desonide (DESOWEN) 0.05 % cream Apply topically 2 (two) times daily. (Patient not taking: Reported on 01/13/2017) 30 g 0  . HYDROcodone-acetaminophen (NORCO/VICODIN) 5-325 MG tablet     . meloxicam (MOBIC) 15 MG tablet  Take 1 tablet (15 mg total) by mouth daily. 30 tablet 0  . naproxen (NAPROSYN) 250 MG tablet Take by mouth 2 (two) times daily with a meal.    . tamsulosin (FLOMAX) 0.4 MG CAPS capsule     . traMADol (ULTRAM) 50 MG tablet Take 1 tablet (50 mg total) by mouth every 8 (eight) hours as needed. 30 tablet 0   No current facility-administered medications for this visit.     PHYSICAL EXAMINATION: ECOG PERFORMANCE STATUS: 0 - Asymptomatic  Vitals:   02/17/17 1543  BP: 125/83  Pulse: 68  Resp: 18  Temp: 97.7 F (36.5 C)  SpO2: 96%   Filed Weights   02/17/17 1543  Weight: 147 lb 14.4 oz (67.1 kg)    GENERAL:alert, no distress and comfortable SKIN: skin color, texture, turgor are normal, no rashes or significant lesions EYES: normal, Conjunctiva are pink and non-injected, sclera clear OROPHARYNX:no exudate, no erythema and lips, buccal mucosa, and tongue normal  NECK: supple, thyroid normal size, non-tender, without nodularity LYMPH:  no palpable lymphadenopathy in the cervical, axillary or inguinal LUNGS: clear to auscultation and percussion with normal breathing effort HEART: regular rate & rhythm and no murmurs and no lower extremity edema ABDOMEN:abdomen soft, non-tender and normal bowel sounds MUSCULOSKELETAL:no cyanosis of digits and no clubbing  NEURO: alert & oriented x 3 with fluent speech, no focal motor/sensory deficits EXTREMITIES: No lower extremity edema BREAST: No palpable masses or nodules in either right or left breasts. No palpable axillary supraclavicular or infraclavicular adenopathy no breast tenderness or nipple discharge. (exam performed in the presence of a chaperone)  LABORATORY DATA:  I have reviewed the data as listed   Chemistry      Component Value Date/Time   NA 140 07/02/2014 1600   K 3.3 (L) 07/02/2014 1600   CL 104 07/02/2014 1600   CO2 28 07/02/2014 1600   BUN 14 07/02/2014 1600   CREATININE 0.67 07/02/2014 1600      Component Value Date/Time     CALCIUM 9.6 07/02/2014 1600   ALKPHOS 67 12/23/2013 0718   AST 18 12/23/2013 0718   ALT 14 12/23/2013 0718   BILITOT 0.7 12/23/2013 0718       Lab Results  Component Value Date   WBC 6.8 07/02/2014   HGB 12.8 07/02/2014   HCT 37.6 07/02/2014   MCV 89.7 07/02/2014   PLT 205 07/02/2014   NEUTROABS 4.6 07/02/2014    ASSESSMENT & PLAN:  Neoplasm of right breast, primary tumor staging category Tis: ductal carcinoma in situ (DCIS) Right breast DCIS status post lumpectomy 07/04/2014: Intermediate grade DCIS with necrosis and calcification spanning 0.6 cm, less than 0.1 cm to posterior and superior margins, ER 90%, PR 2% S/P XRT 09/16/14; started anastrozole 1 mg daily Aug 2016  Anastrozole Toxicities:Patient denies any hot flashes or myalgias. She has mild-mod myalgias Right knee pain: MRI revealed arthritis.  We discussed whether some of her symptoms could be related to anastrozole.  Instructed her to stop anastrozole today and monitor her symptoms for a month.  If her  symptoms improve then we can discuss changing her from anastrozole to tamoxifen.  If her symptoms do not improve then she will resume anastrozole.  Breast Cancer Surveillance: 1. Breast exam  02/17/2017: Normal 2. Mammogram 08/06/2016 No abnormalities. Postsurgical changes. Breast Density Category C.  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  Her other child just graduated high school. RTC in  January to discuss her symptoms and to come up with the treatment plan.  I spent 25 minutes talking to the patient of which more than half was spent in counseling and coordination of care.  No orders of the defined types were placed in this encounter.  The patient has a good understanding of the overall plan. she agrees with it. she will call with any problems that may develop before the next visit here.   Rebekah Eisenmenger,  MD 02/17/17

## 2017-02-21 NOTE — Progress Notes (Signed)
[facility] [address], [place] [address] Phone: [phone] Subjective:    I'm seeing this patient by the request  of:    CC: Knee pain follow-up  NTI:[id number]  Rebekah Larson is a 51 y.o. female coming in with complaint of knee pain.  Patient was found to have a peri-meniscal cyst as well as degenerative joint disease of the right knee.  Patient given a steroid injection and was traveling to [place].  Patient was to continue all other conservative therapy.  Patient states that she has seem some improvement. She said that she no longer has pain but that her knee feels different.  Daily activities have become much easier       Past Medical History:  Diagnosis Date  . Breast cancer, right breast ([facility]) 05/17/14   DUCTAL CARCINOMA IN SITU WITH CALCIFICATIONS  . History of kidney stones   . Mitral valve prolapse    very mild, per pt.  . Neoplasm of right breast, primary tumor staging category Tis: ductal carcinoma in situ (DCIS) 05/2014  . Personal history of radiation therapy   . S/P radiation therapy 08/05/2014 through 09/16/2014   Right breast 4800 cGy in 24 sessions, right breast tumor bed boost 1200 cGy in 6 sessions, for a cumulative tumor bed dose of 6000 cGy in 30 sessions    . Shingles    Past Surgical History:  Procedure Laterality Date  . BREAST LUMPECTOMY     right lumpectomy 2016  . BREAST LUMPECTOMY WITH RADIOACTIVE SEED LOCALIZATION Right 07/04/2014   Procedure: BREAST LUMPECTOMY WITH RADIOACTIVE SEED LOCALIZATION;  Surgeon: Rolm Bookbinder, MD;  Location: [facility];  Service: General;  Laterality: Right;  . WISDOM TOOTH EXTRACTION     Social History   Socioeconomic History  . Marital status: Married    Spouse name: Coralyn Mark  . Number of children: 2  . Years of education: None  . Highest education level: None  Social Needs  . Financial resource strain: None  . Food insecurity -  worry: None  . Food insecurity - inability: None  . Transportation needs - medical: None  . Transportation needs - non-medical: None  Occupational History  . None  Tobacco Use  . Smoking status: Never Smoker  . Smokeless tobacco: Never Used  Substance and Sexual Activity  . Alcohol use: Yes    Comment: 1-2 drinks/week  . Drug use: No  . Sexual activity: Yes    Birth control/protection: Post-menopausal  Other Topics Concern  . None  Social History Narrative   Married. Husband is CEO [employer].       Exercising regularly: walking, elliptical, weights, biking - [place], yoga   No Known Allergies Family History  Problem Relation Age of Onset  . Colon cancer Father 30  . CAD Father 72       CABG x 2 - age 46s, 18s  . Hypertension Father   . Hyperlipidemia Father   . Breast cancer Mother 77       DCIS  . Hypertension Mother   . Cancer Paternal Grandfather 50       spinal tumor  . Breast cancer Other        MGM's sister dx over 6  . Breast cancer Other        MGF's sister dx over 24  . Breast cancer Sister   . CAD Paternal Aunt      Past medical history, social, surgical  and family history all reviewed in electronic medical record.  No pertanent information unless stated regarding to the chief complaint.   Review of Systems:Review of systems updated and as accurate as of 02/22/17  No headache, visual changes, nausea, vomiting, diarrhea, constipation, dizziness, abdominal pain, skin rash, fevers, chills, night sweats, weight loss, swollen lymph nodes, body aches, joint swelling, chest pain, shortness of breath, mood changes.  Positive muscle aches  Objective  Blood pressure 110/80, pulse 65, height 5\' 8"  (1.727 m), weight 149 lb (67.6 kg), last menstrual period 07/10/2009, SpO2 98 %. Systems examined below as of 02/22/17   General: No apparent distress alert and oriented x3 mood and affect normal, dressed appropriately.  HEENT: Pupils equal, extraocular  movements intact  Respiratory: Patient's speak in full sentences and does not appear short of breath  Cardiovascular: No lower extremity edema, non tender, no erythema  Skin: Warm dry intact with no signs of infection or rash on extremities or on axial skeleton.  Abdomen: Soft nontender  Neuro: Cranial nerves II through XII are intact, neurovascularly intact in all extremities with 2+ DTRs and 2+ pulses.  Lymph: No lymphadenopathy of posterior or anterior cervical chain or axillae bilaterally.  Gait normal with good balance and coordination.  MSK:  Non tender with full range of motion and good stability and symmetric strength and tone of shoulders, elbows, wrist, hip, and ankles bilaterally.  Knee: Right Normal to inspection with no erythema or effusion or obvious bony abnormalities. Moderate tenderness over the medial joint line ROM full in flexion and extension and lower leg rotation. Ligaments with solid consistent endpoints including ACL, PCL, LCL, MCL. Negative Mcmurray's, Apley's, and Thessalonian tests. Mild painful patellar compression. Patellar glide mild crepitus. Patellar and quadriceps tendons unremarkable. Hamstring and quadriceps strength is normal. Contralateral knee unremarkable    Impression and Recommendations:     This case required medical decision making of moderate complexity.      Note: This dictation was prepared with Dragon dictation along with smaller phrase technology. Any transcriptional errors that result from this process are unintentional.

## 2017-02-22 ENCOUNTER — Ambulatory Visit (INDEPENDENT_AMBULATORY_CARE_PROVIDER_SITE_OTHER): Payer: PRIVATE HEALTH INSURANCE | Admitting: Family Medicine

## 2017-02-22 ENCOUNTER — Encounter: Payer: Self-pay | Admitting: Family Medicine

## 2017-02-22 DIAGNOSIS — M1711 Unilateral primary osteoarthritis, right knee: Secondary | ICD-10-CM

## 2017-02-22 MED ORDER — VITAMIN D (ERGOCALCIFEROL) 1.25 MG (50000 UNIT) PO CAPS: 50000.0000 [IU] | ORAL_CAPSULE | ORAL | 0 refills | Status: DC

## 2017-02-22 MED ORDER — DICLOFENAC SODIUM 2 % TD SOLN: 2.0000 g | Freq: Two times a day (BID) | TRANSDERMAL | 3 refills | Status: DC

## 2017-02-22 MED FILL — VIT D2 1.25 MG (50,000 UNIT: 1.25 MG | 28 days supply | Qty: 4 | Fill #0

## 2017-02-22 NOTE — Patient Instructions (Addendum)
Happy holidays!  Once weekly vitamin D Ice is your friend pennsaid pinkie amount topically 2 times daily as needed.   If in pain 3 ibuprofen 3 times a day for 3 days Worsening symptoms you know where I am  Otherwise make an appointment in 2-3 months just in case

## 2017-02-22 NOTE — Assessment & Plan Note (Signed)
Discussed with patient again at great length.  We discussed icing regimen and home exercises, we discussed continued conservative therapy.  We discussed taking certain medications when needed and given a prescription for topical anti-inflammatories.  Once weekly vitamin D given as well secondary to the osteopenia.  Follow-up again in 2-3 months

## 2017-02-23 ENCOUNTER — Ambulatory Visit: Payer: PRIVATE HEALTH INSURANCE | Admitting: Family Medicine

## 2017-04-11 NOTE — Assessment & Plan Note (Deleted)
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 She has mild-mod myalgias  Severe Effusion of knee Breast Cancer Surveillance: 1. Breast exam  04/12/17: Normal 2. Mammogram 08/06/2016 No abnormalities. Postsurgical changes. Breast Density Category C.  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

## 2017-04-12 ENCOUNTER — Ambulatory Visit: Payer: PRIVATE HEALTH INSURANCE | Admitting: Hematology and Oncology

## 2017-04-12 NOTE — Progress Notes (Deleted)
Patient Care Team: Binnie Rail, MD as PCP - General (Internal Medicine) Rolm Bookbinder, MD (General Surgery) Dian Queen, MD (Obstetrics and Gynecology) Nicholas Lose, MD (Hematology and Oncology) Arloa Koh, MD as Consulting Physician (Radiation Oncology) Sylvan Cheese, NP as Nurse Practitioner (Hematology and Oncology)  DIAGNOSIS:  Encounter Diagnosis  Name Primary?  . Neoplasm of right breast, primary tumor staging category Tis: ductal carcinoma in situ (DCIS)     SUMMARY OF ONCOLOGIC HISTORY:   Neoplasm of right breast, primary tumor staging category Tis: ductal carcinoma in situ (DCIS)   04/16/2014 Mammogram    Right breast: loose group of calcifications measuring 10 x 5 x 12 mm, slightly coarse and slightly heterogeneous within posterior upper breast      04/27/2014 Breast MRI    No suspicious areas of non mass enhancement identified within either the right or left breast.      05/17/2014 Initial Biopsy    Right breast core needle bx: DCIS with calcifications, intermediate to high grade, ER+ (98%), PR+ (2%).      05/17/2014 Clinical Stage    Stage 0: Tis N0      05/27/2014 Procedure    Genetic testing: BRCA Plus ([facility]) panel reveals no clinically significant variant at BRCA1, BRCA2, CDH1, PALB2, PTEN, and TP53.      07/04/2014 Definitive Surgery    Right breast lumpectomy: DCIS, intermediate grade with necrosis and calcifications, spanning 0.6 cm, less than 0.1 cm to posterior and superior margin, ER+ (98%), PR+ (2%)      07/04/2014 Pathologic Stage    Stage 0: pTis pN0      08/05/2014 - 09/16/2014 Radiation Therapy    Adjuvant RT Valere Dross): Right breast 48 Gy over 24 fractions; right breast tumor bed boost 12 Gy over 6 fractions; total dose 60 Gy         12/02/2014 -  Anti-estrogen oral therapy    Anastrozole 1 mg daily. Planned duration of therapy 5 years.      02/07/2015 Survivorship    Survivorship care plan completed  and mailed to patient.       CHIEF COMPLIANT: Follow-up of DCIS on anastrozole therapy  INTERVAL HISTORY: Rebekah Larson is a 52 year old with above-mentioned history of right breast DCIS who underwent lumpectomy followed by adjuvant radiation is currently on anastrozole therapy.  She has had intermittent arthralgias myalgias.  Recently she had a knee problem with a significant effusion of the knee.  She denies any lumps or nodules in the breast.  REVIEW OF SYSTEMS:   Constitutional: Denies fevers, chills or abnormal weight loss Eyes: Denies blurriness of vision Ears, nose, mouth, throat, and face: Denies mucositis or sore throat Respiratory: Denies cough, dyspnea or wheezes Cardiovascular: Denies palpitation, chest discomfort Gastrointestinal:  Denies nausea, heartburn or change in bowel habits Skin: Denies abnormal skin rashes Lymphatics: Denies new lymphadenopathy or easy bruising Neurological:Denies numbness, tingling or new weaknesses Behavioral/Psych: Mood is stable, no new changes  Extremities: Knee problems with effusion Breast:  denies any pain or lumps or nodules in either breasts All other systems were reviewed with the patient and are negative.  I have reviewed the past medical history, past surgical history, social history and family history with the patient and they are unchanged from previous note.  ALLERGIES:  has No Known Allergies.  MEDICATIONS:  Current Outpatient Medications  Medication Sig Dispense Refill  . cholecalciferol (VITAMIN D) 1000 UNITS tablet Take 1,000 Units by mouth daily. Reported on 04/22/2015    .  desonide (DESOWEN) 0.05 % cream Apply topically 2 (two) times daily. 30 g 0  . Diclofenac Sodium 2 % SOLN Place 2 g onto the skin 2 (two) times daily. 112 g 3  . HYDROcodone-acetaminophen (NORCO/VICODIN) 5-325 MG tablet     . meloxicam (MOBIC) 15 MG tablet Take 1 tablet (15 mg total) by mouth daily. 30 tablet 0  . naproxen (NAPROSYN) 250 MG tablet  Take by mouth 2 (two) times daily with a meal.    . tamsulosin (FLOMAX) 0.4 MG CAPS capsule     . traMADol (ULTRAM) 50 MG tablet Take 1 tablet (50 mg total) by mouth every 8 (eight) hours as needed. 30 tablet 0  . Vitamin D, Ergocalciferol, (DRISDOL) 50000 units CAPS capsule Take 1 capsule (50,000 Units total) by mouth every 7 (seven) days. 12 capsule 0   No current facility-administered medications for this visit.     PHYSICAL EXAMINATION: ECOG PERFORMANCE STATUS: 1 - Symptomatic but completely ambulatory  There were no vitals filed for this visit. There were no vitals filed for this visit.  GENERAL:alert, no distress and comfortable SKIN: skin color, texture, turgor are normal, no rashes or significant lesions EYES: normal, Conjunctiva are pink and non-injected, sclera clear OROPHARYNX:no exudate, no erythema and lips, buccal mucosa, and tongue normal  NECK: supple, thyroid normal size, non-tender, without nodularity LYMPH:  no palpable lymphadenopathy in the cervical, axillary or inguinal LUNGS: clear to auscultation and percussion with normal breathing effort HEART: regular rate & rhythm and no murmurs and no lower extremity edema ABDOMEN:abdomen soft, non-tender and normal bowel sounds MUSCULOSKELETAL:no cyanosis of digits and no clubbing  NEURO: alert & oriented x 3 with fluent speech, no focal motor/sensory deficits EXTREMITIES: No lower extremity edema BREAST: No palpable masses or nodules in either right or left breasts. No palpable axillary supraclavicular or infraclavicular adenopathy no breast tenderness or nipple discharge. (exam performed in the presence of a chaperone)  LABORATORY DATA:  I have reviewed the data as listed CMP Latest Ref Rng & Units 07/02/2014 12/23/2013 11/30/2010  Glucose 70 - 99 mg/dL 83 116(H) 93  BUN 6 - 23 mg/dL '14 22 15  ' Creatinine 0.50 - 1.10 mg/dL 0.67 0.72 0.8  Sodium 135 - 145 mmol/L 140 143 143  Potassium 3.5 - 5.1 mmol/L 3.3(L) 3.8 4.0    Chloride 96 - 112 mmol/L 104 106 104  CO2 19 - 32 mmol/L '28 25 30  ' Calcium 8.4 - 10.5 mg/dL 9.6 9.1 9.3  Total Protein 6.0 - 8.3 g/dL - 6.7 6.9  Total Bilirubin 0.3 - 1.2 mg/dL - 0.7 0.6  Alkaline Phos 39 - 117 U/L - 67 70  AST 0 - 37 U/L - 18 22  ALT 0 - 35 U/L - 14 17    Lab Results  Component Value Date   WBC 6.8 07/02/2014   HGB 12.8 07/02/2014   HCT 37.6 07/02/2014   MCV 89.7 07/02/2014   PLT 205 07/02/2014   NEUTROABS 4.6 07/02/2014    ASSESSMENT & PLAN:  Neoplasm of right breast, primary tumor staging category Tis: ductal carcinoma in situ (DCIS) Right breast DCIS status post lumpectomy 07/04/2014: Intermediate grade DCIS with necrosis and calcification spanning 0.6 cm, less than 0.1 cm to posterior and superior margins, ER 90%, PR 2% S/P XRT 09/16/14; started anastrozole 1 mg daily Aug 2016  Anastrozole Toxicities:Patient denies any hot flashes or myalgias. She has mild-mod myalgias  Severe Effusion of knee Breast Cancer Surveillance: 1. Breast exam  04/12/17: Normal  2. Mammogram 08/06/2016 No abnormalities. Postsurgical changes. Breast Density Category C.  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  No orders of the defined types were placed in this encounter.  The patient has a good understanding of the overall plan. she agrees with it. she will call with any problems that may develop before the next visit here.   Harriette Ohara, MD 04/12/17

## 2017-04-25 NOTE — Progress Notes (Signed)
[facility] [address] [place], [place] [address] Phone: [phone] Subjective:     CC: knee pain follow up   [id number]  Rebekah Larson is a 52 y.o. female coming in with complaint of knee pain.  Found to have osteoarthritic changes.  Has been over 3 months since patient had an injection.  Patient had been traveling a lot.  Patient states doing significantly better at this time.  Patient is writing her knee.  Patient is really having minimal pain at all.  Very aware of it but nothing severe.  Very happy with the results.      Past Medical History:  Diagnosis Date  . Breast cancer, right breast ([facility]) 05/17/14   DUCTAL CARCINOMA IN SITU WITH CALCIFICATIONS  . History of kidney stones   . Mitral valve prolapse    very mild, per pt.  . Neoplasm of right breast, primary tumor staging category Tis: ductal carcinoma in situ (DCIS) 05/2014  . Personal history of radiation therapy   . S/P radiation therapy 08/05/2014 through 09/16/2014   Right breast 4800 cGy in 24 sessions, right breast tumor bed boost 1200 cGy in 6 sessions, for a cumulative tumor bed dose of 6000 cGy in 30 sessions    . Shingles    Past Surgical History:  Procedure Laterality Date  . BREAST LUMPECTOMY     right lumpectomy 2016  . BREAST LUMPECTOMY WITH RADIOACTIVE SEED LOCALIZATION Right 07/04/2014   Procedure: BREAST LUMPECTOMY WITH RADIOACTIVE SEED LOCALIZATION;  Surgeon: Rolm Bookbinder, MD;  Location: [facility];  Service: General;  Laterality: Right;  . WISDOM TOOTH EXTRACTION     Social History   Socioeconomic History  . Marital status: Married    Spouse name: Coralyn Mark  . Number of children: 2  . Years of education: Not on file  . Highest education level: Not on file  Social Needs  . Financial resource strain: Not on file  . Food insecurity - worry: Not on file  . Food insecurity - inability: Not on file  . Transportation needs -  medical: Not on file  . Transportation needs - non-medical: Not on file  Occupational History  . Not on file  Tobacco Use  . Smoking status: Never Smoker  . Smokeless tobacco: Never Used  Substance and Sexual Activity  . Alcohol use: Yes    Comment: 1-2 drinks/week  . Drug use: No  . Sexual activity: Yes    Birth control/protection: Post-menopausal  Other Topics Concern  . Not on file  Social History Narrative   Married. Husband is CEO [employer].       Exercising regularly: walking, elliptical, weights, biking - peleton, yoga   No Known Allergies Family History  Problem Relation Age of Onset  . Colon cancer Father 75  . CAD Father 72       CABG x 2 - age 69s, 34s  . Hypertension Father   . Hyperlipidemia Father   . Breast cancer Mother 13       DCIS  . Hypertension Mother   . Cancer Paternal Grandfather 50       spinal tumor  . Breast cancer Other        MGM's sister dx over 41  . Breast cancer Other        MGF's sister dx over 31  . Breast cancer Sister   . CAD Paternal Aunt      Past medical history, social,  surgical and family history all reviewed in electronic medical record.  No pertanent information unless stated regarding to the chief complaint.   Review of Systems:Review of systems updated and as accurate as of 04/26/17  No headache, visual changes, nausea, vomiting, diarrhea, constipation, dizziness, abdominal pain, skin rash, fevers, chills, night sweats, weight loss, swollen lymph nodes, body aches, joint swelling, muscle aches, chest pain, shortness of breath, mood changes.   Objective  Blood pressure 104/82, pulse (!) 55, weight 148 lb (67.1 kg), last menstrual period 07/10/2009, SpO2 97 %. Systems examined below as of 04/26/17   General: No apparent distress alert and oriented x3 mood and affect normal, dressed appropriately.  HEENT: Pupils equal, extraocular movements intact  Respiratory: Patient's speak in full sentences and does not  appear short of breath  Cardiovascular: No lower extremity edema, non tender, no erythema  Skin: Warm dry intact with no signs of infection or rash on extremities or on axial skeleton.  Abdomen: Soft nontender  Neuro: Cranial nerves II through XII are intact, neurovascularly intact in all extremities with 2+ DTRs and 2+ pulses.  Lymph: No lymphadenopathy of posterior or anterior cervical chain or axillae bilaterally.  Gait normal with good balance and coordination.  MSK:  Non tender with full range of motion and good stability and symmetric strength and tone of shoulders, elbows, wrist, hip, and ankles bilaterally.  Right knee has some very mild crepitus.  Minimal instability noted on exam.  Full range of motion.  No swelling.  Negative patellar grind test.  Mild positive McMurray's.  Minimal pain over the medial joint space contralateral knee unremarkable    Impression and Recommendations:     This case required medical decision making of moderate complexity.      Note: This dictation was prepared with Dragon dictation along with smaller phrase technology. Any transcriptional errors that result from this process are unintentional.

## 2017-04-26 ENCOUNTER — Ambulatory Visit (INDEPENDENT_AMBULATORY_CARE_PROVIDER_SITE_OTHER): Payer: PRIVATE HEALTH INSURANCE | Admitting: Family Medicine

## 2017-04-26 ENCOUNTER — Encounter: Payer: Self-pay | Admitting: Family Medicine

## 2017-04-26 DIAGNOSIS — M1711 Unilateral primary osteoarthritis, right knee: Secondary | ICD-10-CM

## 2017-04-26 NOTE — Assessment & Plan Note (Signed)
Patient is doing well overall.  Patient discussed icing regimen.  Patient will continue with the regimen and follow-up with me in 3 months

## 2017-04-26 NOTE — Patient Instructions (Signed)
You are awesome! Keep it up  Pennsaid when you need it Ice when you need it as well  See me again in 3 months  [phone]

## 2017-06-10 ENCOUNTER — Other Ambulatory Visit: Payer: Self-pay | Admitting: Family Medicine

## 2017-07-14 MED FILL — MELOXICAM 15 MG TABLET: 15 | 30 days supply | Qty: 30 | Fill #0

## 2017-08-06 NOTE — Progress Notes (Signed)
[facility] [address] [place], [place] [address] Phone: [phone] Subjective:      CC: Knee pain follow-up  [id number]  Rebekah Larson is a 52 y.o. female coming in with complaint of knee pain.  Found to have moderate to severe osteoarthritic changes.  Has been doing very well with conservative therapy with but did undergo Visco supplementation.  Has been 3 months since patient's previous appointment.  Patient states that she has not been talking her medication but does note when she does she feels better. Pain is more medial than anterior medial.  Pain is constant but bearable.        Past Medical History:  Diagnosis Date  . Breast cancer, right breast ([facility]) 05/17/14   DUCTAL CARCINOMA IN SITU WITH CALCIFICATIONS  . History of kidney stones   . Mitral valve prolapse    very mild, per pt.  . Neoplasm of right breast, primary tumor staging category Tis: ductal carcinoma in situ (DCIS) 05/2014  . Personal history of radiation therapy   . S/P radiation therapy 08/05/2014 through 09/16/2014   Right breast 4800 cGy in 24 sessions, right breast tumor bed boost 1200 cGy in 6 sessions, for a cumulative tumor bed dose of 6000 cGy in 30 sessions    . Shingles    Past Surgical History:  Procedure Laterality Date  . BREAST LUMPECTOMY     right lumpectomy 2016  . BREAST LUMPECTOMY WITH RADIOACTIVE SEED LOCALIZATION Right 07/04/2014   Procedure: BREAST LUMPECTOMY WITH RADIOACTIVE SEED LOCALIZATION;  Surgeon: Rolm Bookbinder, MD;  Location: [facility];  Service: General;  Laterality: Right;  . WISDOM TOOTH EXTRACTION     Social History   Socioeconomic History  . Marital status: Married    Spouse name: Coralyn Mark  . Number of children: 2  . Years of education: Not on file  . Highest education level: Not on file  Occupational History  . Not on file  Social Needs  . Financial resource strain: Not on file  . Food  insecurity:    Worry: Not on file    Inability: Not on file  . Transportation needs:    Medical: Not on file    Non-medical: Not on file  Tobacco Use  . Smoking status: Never Smoker  . Smokeless tobacco: Never Used  Substance and Sexual Activity  . Alcohol use: Yes    Comment: 1-2 drinks/week  . Drug use: No  . Sexual activity: Yes    Birth control/protection: Post-menopausal  Lifestyle  . Physical activity:    Days per week: Not on file    Minutes per session: Not on file  . Stress: Not on file  Relationships  . Social connections:    Talks on phone: Not on file    Gets together: Not on file    Attends religious service: Not on file    Active member of club or organization: Not on file    Attends meetings of clubs or organizations: Not on file    Relationship status: Not on file  Other Topics Concern  . Not on file  Social History Narrative   Married. Husband is CEO [employer].       Exercising regularly: walking, elliptical, weights, biking - peleton, yoga   No Known Allergies Family History  Problem Relation Age of Onset  . Colon cancer Father 53  . CAD Father 46       CABG x 2 -  age 79s, 98s  . Hypertension Father   . Hyperlipidemia Father   . Breast cancer Mother 86       DCIS  . Hypertension Mother   . Cancer Paternal Grandfather 50       spinal tumor  . Breast cancer Other        MGM's sister dx over 63  . Breast cancer Other        MGF's sister dx over 84  . Breast cancer Sister   . CAD Paternal Aunt      Past medical history, social, surgical and family history all reviewed in electronic medical record.  No pertanent information unless stated regarding to the chief complaint.   Review of Systems:Review of systems updated and as accurate as of 08/08/17  No headache, visual changes, nausea, vomiting, diarrhea, constipation, dizziness, abdominal pain, skin rash, fevers, chills, night sweats, weight loss, swollen lymph nodes, body aches, joint  swelling, muscle aches, chest pain, shortness of breath, mood changes.   Objective  Blood pressure 108/70, pulse 61, height 5\' 8"  (1.727 m), weight 147 lb (66.7 kg), last menstrual period 07/10/2009, SpO2 98 %. Systems examined below as of 08/08/17   General: No apparent distress alert and oriented x3 mood and affect normal, dressed appropriately.  HEENT: Pupils equal, extraocular movements intact  Respiratory: Patient's speak in full sentences and does not appear short of breath  Cardiovascular: No lower extremity edema, non tender, no erythema  Skin: Warm dry intact with no signs of infection or rash on extremities or on axial skeleton.  Abdomen: Soft nontender  Neuro: Cranial nerves II through XII are intact, neurovascularly intact in all extremities with 2+ DTRs and 2+ pulses.  Lymph: No lymphadenopathy of posterior or anterior cervical chain or axillae bilaterally.  Gait mild antalgic gait MSK:  Non tender with full range of motion and good stability and symmetric strength and tone of shoulders, elbows, wrist, hip, and ankles bilaterally.  Knee: Right valgus deformity noted.  Abnormal thigh to calf ratio.  Tender to palpation over medial and PF joint line.  ROM full in flexion and extension and lower leg rotation. instability with valgus force.  painful patellar compression. Patellar glide with moderate crepitus. Patellar and quadriceps tendons unremarkable. Hamstring and quadriceps strength is normal. Contralateral knee shows no significant arthritic changes  After informed written and verbal consent, patient was seated on exam table. Right knee was prepped with alcohol swab and utilizing anterolateral approach, patient's right knee space was injected with 4:1  marcaine 0.5%: Kenalog 40mg /dL. Patient tolerated the procedure well without immediate complications.    Impression and Recommendations:     This case required medical decision making of moderate complexity.       Note: This dictation was prepared with Dragon dictation along with smaller phrase technology. Any transcriptional errors that result from this process are unintentional.

## 2017-08-08 ENCOUNTER — Ambulatory Visit: Payer: PRIVATE HEALTH INSURANCE | Admitting: Family Medicine

## 2017-08-08 ENCOUNTER — Encounter: Payer: Self-pay | Admitting: Family Medicine

## 2017-08-08 DIAGNOSIS — M1711 Unilateral primary osteoarthritis, right knee: Secondary | ICD-10-CM

## 2017-08-08 MED ORDER — DICLOFENAC SODIUM 2 % TD SOLN: 2.0000 g | Freq: Two times a day (BID) | TRANSDERMAL | 3 refills | Status: DC

## 2017-08-08 NOTE — Patient Instructions (Signed)
Good to see you  We injected the knee again  Ice 20 minutes 2 times daily. Usually after activity and before bed. pennsaid pinkie amount topically 2 times daily as needed.  Stay active Have a great hike See me again in 4 weeks if not better otherwise we can safely repeat injection every 3 months

## 2017-08-08 NOTE — Assessment & Plan Note (Signed)
Patient does have arthritic changes.  We will continue brace.  Given injection today due to worsening pain.  Discussed the possibility of Visco supplementation if this does not seem to work.  Given topical anti-inflammatory prescription again.  Follow-up again in 4 weeks

## 2017-09-05 NOTE — Progress Notes (Signed)
[facility] [place], [place] [address] Phone: [phone] Subjective:    I'm seeing this patient by the request  of:    CC: Knee pain and new onset back pain  [id number]  Rebekah Larson is a 52 y.o. female coming in with complaint of knee pain.  Right-sided.  Known to have arthritic changes.  Given injection 1 month ago.  States that it did okay for the hike but that unfortunately afterwards started having increasing discomfort and pain again.  Now seems to be improving again.  Patient is trying to be active.  Patient is encouraged by weight loss and seems to be helping some of the knee as well.  No swelling noted.  Mild back pain.  Has had off-and-on for some time.  Patient states massage seems to help.  No radiation of pain no numbness.  No weakness of the lower extremities.  Rates the severity pain 5 out of 10 and ice and massage seems to help     Past Medical History:  Diagnosis Date  . Breast cancer, right breast ([facility]) 05/17/14   DUCTAL CARCINOMA IN SITU WITH CALCIFICATIONS  . History of kidney stones   . Mitral valve prolapse    very mild, per pt.  . Neoplasm of right breast, primary tumor staging category Tis: ductal carcinoma in situ (DCIS) 05/2014  . Personal history of radiation therapy   . S/P radiation therapy 08/05/2014 through 09/16/2014   Right breast 4800 cGy in 24 sessions, right breast tumor bed boost 1200 cGy in 6 sessions, for a cumulative tumor bed dose of 6000 cGy in 30 sessions    . Shingles    Past Surgical History:  Procedure Laterality Date  . BREAST LUMPECTOMY     right lumpectomy 2016  . BREAST LUMPECTOMY WITH RADIOACTIVE SEED LOCALIZATION Right 07/04/2014   Procedure: BREAST LUMPECTOMY WITH RADIOACTIVE SEED LOCALIZATION;  Surgeon: Rolm Bookbinder, MD;  Location: [facility];  Service: General;  Laterality: Right;  . WISDOM TOOTH EXTRACTION     Social History    Socioeconomic History  . Marital status: Married    Spouse name: Coralyn Mark  . Number of children: 2  . Years of education: Not on file  . Highest education level: Not on file  Occupational History  . Not on file  Social Needs  . Financial resource strain: Not on file  . Food insecurity:    Worry: Not on file    Inability: Not on file  . Transportation needs:    Medical: Not on file    Non-medical: Not on file  Tobacco Use  . Smoking status: Never Smoker  . Smokeless tobacco: Never Used  Substance and Sexual Activity  . Alcohol use: Yes    Comment: 1-2 drinks/week  . Drug use: No  . Sexual activity: Yes    Birth control/protection: Post-menopausal  Lifestyle  . Physical activity:    Days per week: Not on file    Minutes per session: Not on file  . Stress: Not on file  Relationships  . Social connections:    Talks on phone: Not on file    Gets together: Not on file    Attends religious service: Not on file    Active member of club or organization: Not on file    Attends meetings of clubs or organizations: Not on file    Relationship status: Not on file  Other Topics Concern  . Not on  file  Social History Narrative   Married. Husband is CEO [employer].       Exercising regularly: walking, elliptical, weights, biking - peleton, yoga   No Known Allergies Family History  Problem Relation Age of Onset  . Colon cancer Father 61  . CAD Father 37       CABG x 2 - age 69s, 80s  . Hypertension Father   . Hyperlipidemia Father   . Breast cancer Mother 33       DCIS  . Hypertension Mother   . Cancer Paternal Grandfather 50       spinal tumor  . Breast cancer Other        MGM's sister dx over 16  . Breast cancer Other        MGF's sister dx over 33  . Breast cancer Sister   . CAD Paternal Aunt      Past medical history, social, surgical and family history all reviewed in electronic medical record.  No pertanent information unless stated regarding to the  chief complaint.   Review of Systems:Review of systems updated and as accurate as of 09/06/17  No headache, visual changes, nausea, vomiting, diarrhea, constipation, dizziness, abdominal pain, skin rash, fevers, chills, night sweats, weight loss, swollen lymph nodes, body aches, joint swelling, muscle aches, chest pain, shortness of breath, mood changes.   Objective  Blood pressure 138/84, pulse 61, height 5\' 8"  (1.727 m), weight 145 lb (65.8 kg), last menstrual period 07/10/2009, SpO2 98 %. Systems examined below as of 09/06/17   General: No apparent distress alert and oriented x3 mood and affect normal, dressed appropriately.  HEENT: Pupils equal, extraocular movements intact  Respiratory: Patient's speak in full sentences and does not appear short of breath  Cardiovascular: No lower extremity edema, non tender, no erythema  Skin: Warm dry intact with no signs of infection or rash on extremities or on axial skeleton.  Abdomen: Soft nontender  Neuro: Cranial nerves II through XII are intact, neurovascularly intact in all extremities with 2+ DTRs and 2+ pulses.  Lymph: No lymphadenopathy of posterior or anterior cervical chain or axillae bilaterally.  Gait n antalgic MSK:  Non tender with full range of motion and good stability and symmetric strength and tone of shoulders, elbows, wrist, hip,and ankles bilaterally.  Knee: Right valgus deformity noted.  Abnormal thigh to calf ratio.  Tender to palpation over medial and PF joint line.  ROM full in flexion and extension and lower leg rotation. instability with valgus force.  painful patellar compression. Patellar glide with moderate crepitus. Patellar and quadriceps tendons unremarkable. Hamstring and quadriceps strength is normal. Contralateral knee shows fairly unremarkable  Back Exam:  Inspection: Unremarkable  Motion: Flexion 45 deg, Extension 25 deg, Side Bending to 45 deg bilaterally,  Rotation to 45 deg bilaterally  SLR laying:  Negative  XSLR laying: Negative  Palpable tenderness: Tightness of the paraspinal musculature lumbar spine bilaterally. FABER: Tightness bilaterally. Sensory change: Gross sensation intact to all lumbar and sacral dermatomes.  Reflexes: 2+ at both patellar tendons, 2+ at achilles tendons, Babinski's downgoing.  Strength at foot  Plantar-flexion: 5/5 Dorsi-flexion: 5/5 Eversion: 5/5 Inversion: 5/5  Leg strength  Quad: 5/5 Hamstring: 5/5 Hip flexor: 5/5 Hip abductors: 5/5    Osteopathic findings C2 flexed rotated and side bent right T3 extended rotated and side bent right inhaled third rib T9 extended rotated and side bent left L2 flexed rotated and side bent right Sacrum right on right  Impression and Recommendations:     This case required medical decision making of moderate complexity.      Note: This dictation was prepared with Dragon dictation along with smaller phrase technology. Any transcriptional errors that result from this process are unintentional.

## 2017-09-06 ENCOUNTER — Ambulatory Visit: Payer: PRIVATE HEALTH INSURANCE | Admitting: Family Medicine

## 2017-09-06 ENCOUNTER — Encounter: Payer: Self-pay | Admitting: Family Medicine

## 2017-09-06 DIAGNOSIS — M1711 Unilateral primary osteoarthritis, right knee: Secondary | ICD-10-CM

## 2017-09-06 DIAGNOSIS — M999 Biomechanical lesion, unspecified: Secondary | ICD-10-CM | POA: Diagnosis not present

## 2017-09-06 DIAGNOSIS — M545 Low back pain, unspecified: Secondary | ICD-10-CM | POA: Insufficient documentation

## 2017-09-06 DIAGNOSIS — G8929 Other chronic pain: Secondary | ICD-10-CM

## 2017-09-06 NOTE — Assessment & Plan Note (Signed)
Discussed with patient in great length.  We discussed icing regimen and home exercise.  Discussed which activities of doing which wants to avoid.  Patient does not want to start Visco supplementation at this point.  Can repeat injections if needed.  Continue with the bracing.  Follow-up again in 4 to 8 weeks

## 2017-09-06 NOTE — Assessment & Plan Note (Signed)
Low back pain.  Likely multifactorial.  Discussed icing regimen and home exercise.  Discussed which activities of doing which wants to avoid.  Increase activity as tolerated.  Follow-up with me again in 4 to 6 weeks.  Responded well to manipulation

## 2017-09-06 NOTE — Patient Instructions (Signed)
Good to see you  Diest higher in Omega 3 and lower in Omega 6 Lot of basic water No carbs after 6  We manipulated your back  I hope it helps Read about grade 4 chondromalacia  See me again in 4-5 weeks!

## 2017-09-06 NOTE — Assessment & Plan Note (Signed)
Decision today to treat with OMT was based on Physical Exam  After verbal consent patient was treated with HVLA, ME, FPR techniques in cervical, thoracic, lumbar and sacral areas  Patient tolerated the procedure well with improvement in symptoms  Patient given exercises, stretches and lifestyle modifications  See medications in patient instructions if given  Patient will follow up in 4-6 weeks 

## 2017-09-16 MED FILL — VIT D2 1.25 MG (50,000 UNIT: 1.25 MG | 70 days supply | Qty: 10 | Fill #0

## 2017-10-07 NOTE — Progress Notes (Signed)
[facility] [place], [place] [address] Phone: [phone] Subjective:     CC: Right knee  JJK:[id number]  Rebekah Larson is a 52 y.o. female coming in with complaint of knee pain. States that her knee is feeling ok. Tries to take medicine when she doesn't need it. Feels 90% without medication. She thinks she's "doing pretty well". Some severe arthritic changes.  Responded very well to Visco supplementation.  Not having any significant difficulties at this time.     Past Medical History:  Diagnosis Date  . Breast cancer, right breast ([place]) 05/17/14   DUCTAL CARCINOMA IN SITU WITH CALCIFICATIONS  . History of kidney stones   . Mitral valve prolapse    very mild, per pt.  . Neoplasm of right breast, primary tumor staging category Tis: ductal carcinoma in situ (DCIS) 05/2014  . Personal history of radiation therapy   . S/P radiation therapy 08/05/2014 through 09/16/2014   Right breast 4800 cGy in 24 sessions, right breast tumor bed boost 1200 cGy in 6 sessions, for a cumulative tumor bed dose of 6000 cGy in 30 sessions    . Shingles    Past Surgical History:  Procedure Laterality Date  . BREAST LUMPECTOMY     right lumpectomy 2016  . BREAST LUMPECTOMY WITH RADIOACTIVE SEED LOCALIZATION Right 07/04/2014   Procedure: BREAST LUMPECTOMY WITH RADIOACTIVE SEED LOCALIZATION;  Surgeon: Rolm Bookbinder, MD;  Location: [facility];  Service: General;  Laterality: Right;  . WISDOM TOOTH EXTRACTION     Social History   Socioeconomic History  . Marital status: Married    Spouse name: Coralyn Mark  . Number of children: 2  . Years of education: Not on file  . Highest education level: Not on file  Occupational History  . Not on file  Social Needs  . Financial resource strain: Not on file  . Food insecurity:    Worry: Not on file    Inability: Not on file  . Transportation needs:    Medical: Not on file   Non-medical: Not on file  Tobacco Use  . Smoking status: Never Smoker  . Smokeless tobacco: Never Used  Substance and Sexual Activity  . Alcohol use: Yes    Comment: 1-2 drinks/week  . Drug use: No  . Sexual activity: Yes    Birth control/protection: Post-menopausal  Lifestyle  . Physical activity:    Days per week: Not on file    Minutes per session: Not on file  . Stress: Not on file  Relationships  . Social connections:    Talks on phone: Not on file    Gets together: Not on file    Attends religious service: Not on file    Active member of club or organization: Not on file    Attends meetings of clubs or organizations: Not on file    Relationship status: Not on file  Other Topics Concern  . Not on file  Social History Narrative   Married. Husband is CEO [employer].       Exercising regularly: walking, elliptical, weights, biking - [place], yoga   No Known Allergies Family History  Problem Relation Age of Onset  . Colon cancer Father 13  . CAD Father 19       CABG x 2 - age 26s, 47s  . Hypertension Father   . Hyperlipidemia Father   . Breast cancer Mother 73       DCIS  .  Hypertension Mother   . Cancer Paternal Grandfather 50       spinal tumor  . Breast cancer Other        MGM's sister dx over 44  . Breast cancer Other        MGF's sister dx over 51  . Breast cancer Sister   . CAD Paternal Aunt      Past medical history, social, surgical and family history all reviewed in electronic medical record.  No pertanent information unless stated regarding to the chief complaint.   Review of Systems:Review of systems updated and as accurate as of 10/10/17  No headache, visual changes, nausea, vomiting, diarrhea, constipation, dizziness, abdominal pain, skin rash, fevers, chills, night sweats, weight loss, swollen lymph nodes, body aches, joint swelling, muscle aches, chest pain, shortness of breath, mood changes.   Objective  Blood pressure 100/80, pulse  61, height 5\' 8"  (1.727 m), weight 157 lb (71.2 kg), last menstrual period 07/10/2009, SpO2 97 %. Systems examined below as of 10/10/17   General: No apparent distress alert and oriented x3 mood and affect normal, dressed appropriately.  HEENT: Pupils equal, extraocular movements intact  Respiratory: Patient's speak in full sentences and does not appear short of breath  Cardiovascular: No lower extremity edema, non tender, no erythema  Skin: Warm dry intact with no signs of infection or rash on extremities or on axial skeleton.  Abdomen: Soft nontender  Neuro: Cranial nerves II through XII are intact, neurovascularly intact in all extremities with 2+ DTRs and 2+ pulses.  Lymph: No lymphadenopathy of posterior or anterior cervical chain or axillae bilaterally.  Gait antalgic MSK:  Non tender with full range of motion and good stability and symmetric strength and tone of shoulders, elbows, wrist, hip, and ankles bilaterally.  Knee: Right valgus deformity noted.  Abnormal thigh to calf ratio.  Tender to palpation over medial and PF joint line.  ROM full in flexion and extension and lower leg rotation. instability with valgus force.  painful patellar compression. Patellar glide with moderate crepitus. Patellar and quadriceps tendons unremarkable. Hamstring and quadriceps strength is normal. Contralateral knee shows no significant arthritic changes   Impression and Recommendations:     This case required medical decision making of moderate complexity.      Note: This dictation was prepared with Dragon dictation along with smaller phrase technology. Any transcriptional errors that result from this process are unintentional.

## 2017-10-10 ENCOUNTER — Ambulatory Visit: Payer: PRIVATE HEALTH INSURANCE | Admitting: Family Medicine

## 2017-10-10 ENCOUNTER — Encounter: Payer: Self-pay | Admitting: Family Medicine

## 2017-10-10 VITALS — BP 100/80 | HR 61 | Ht 68.0 in | Wt 157.0 lb

## 2017-10-10 DIAGNOSIS — G8929 Other chronic pain: Secondary | ICD-10-CM

## 2017-10-10 DIAGNOSIS — M1711 Unilateral primary osteoarthritis, right knee: Secondary | ICD-10-CM

## 2017-10-10 DIAGNOSIS — M25569 Pain in unspecified knee: Secondary | ICD-10-CM

## 2017-10-10 NOTE — Assessment & Plan Note (Signed)
Overall patient is doing relatively well.  We discussed about the back and possibility of manipulation which patient declined.  Patient is wanting to discuss the with orthopedic surgery about the possibility of replacement and wants to discuss to make sure everything is going all right.  Patient would also like a nutrition consult to make sure that patient can hopefully avoid surgery by decreasing the amount of inflammation in her diet.  Follow-up with me again 4-8 weeks  Did discuss the possibility of PRP with some new evidence with osteoarthritis but once again the only curative approach would be surgical replacement.

## 2017-10-10 NOTE — Patient Instructions (Signed)
Good to see you  Read about PRP and call me if you want to try or my chart Keep it up  I am proud of you  We will also get you in with Iver Nestle  I am here if you need me

## 2017-11-10 ENCOUNTER — Encounter: Payer: Self-pay | Admitting: Family Medicine

## 2017-11-10 ENCOUNTER — Ambulatory Visit (INDEPENDENT_AMBULATORY_CARE_PROVIDER_SITE_OTHER): Payer: PRIVATE HEALTH INSURANCE | Admitting: Family Medicine

## 2017-11-10 DIAGNOSIS — E7800 Pure hypercholesterolemia, unspecified: Secondary | ICD-10-CM | POA: Diagnosis not present

## 2017-11-10 NOTE — Progress Notes (Signed)
Medical Nutrition Therapy:  Appt start time: 1600 end time:  1700. PCP Dian Queen, MD  Assessment:  Primary concerns today: elevated LDL and anti-inflammatory diet questions.  Rebekah Larson was referred by Charlann Boxer, DO.  She has a significant family history of CVD, and has an elevated LDL, which she would like to lower through dietary efforts.  She also has OA in both knees, especially severe in her R knee, so she feels anything she can do to minimize inflammation may be helpful.  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    Lipid profile 09/07/17:TC 234    HDL 89    TG 65    LDL 129  Learning Readiness: Change in progress; Rebekah Larson's diet is actually very healthy and balanced.  She would like to learn how to further optimize what she eats.  Usual eating pattern: 3 meals and 0-2 snacks per day. Frequent foods and beverages: water, coffee with vanilla soy crmr, 0-2 mixed drinks/week; bkfst of overnight steel-cut oats, chia seeds, walnut milk, berries, dried cherries, alm butter; lunch of school cafeteria soup or salad; dinner of protein (chx, fish, grass-fed beef), starch, veg's (eat restaurant foods 2-3 X wk).   Avoided foods: milk and yogurt (just feels better not drinking).  Usual physical activity: 30 min [place] cycling 4 X wk.  Also includes 15-20 min free weights through [employer] 4 X wk.   Sleep: Estimates she gets ~5 1/2 to 6 hrs/night.    24-hr recall: (Up at 5 AM) B (5 AM)-   12 oz coffee with 2 tbsp vanilla soy crmr Snk (8:30)-   12 oz coffee, 2 tbsp vanilla soy crmr, 1/4 c dry steel-cut oats, 1 tsp chia seeds, 10 oz walnut milk, 1/4 c berries, 10 dried cherries, 1 tsp alm butter, 1 tsp shredded coconut (done by 10:30) L (12:30 PM)-  1 salad, 1 tbsp cheddar, 1/3 c quinoa-beet-carrot, 1/4 c quinoa-corn-pepper, 2 tbsp ranch, 3 oz chx, water Snk (3:30)-  2 c popcorn, water D (9 PM)-  1 chx thigh, 1 1/2 c potatoes, salad w/ chs bacon, & sour crm Snk ( PM)-   Typical day? No.   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  Hopes to eat earlier once volleyball season is over (Oct).    Progress Towards Goal(s):  In progress.  Intervention:  Nutrition education  Handouts given during visit include:  After-Visit Summary (AVS)  Multiple recipes  Anti-inflammatory diet handouts (2)  Monitoring/Evaluation:  Dietary intake, exercise, and body weight prn.

## 2017-11-10 NOTE — Patient Instructions (Addendum)
-   Be sure to follow up on your retesting for Vitamin D to know if you are now in normal range.    You can feel free to use any UNsaturated oil and X-V olive oil.   - Extra-virgin olive oil will lower LDL without lowering HDL. Avocado is also an excellent quality fat.  - Most polyunsaturated fats (corn, sunflower, walnut, [liquid-at-room-temp oils]) will lower all cholesterol fractions, including HDL.    - Foods you may want to increase:  - Veg's, especially leafy greens, including cooked.    - Beans (soluble fiber helpful to lowering cholesterol)  - Soy foods (beans, tofu, tempeh)  - Nut and seed oils (not to replace all XVOO)  ? Role of probiotics ?  - Supplements ?  Tart Cherry; Omega-3 (look for product that specifies amts of both EPA & DHA) - probably at least 1200 mg/day.    I will email some recipes discussed today, as well as anti-inflammatory diet handouts.

## 2018-02-13 ENCOUNTER — Other Ambulatory Visit: Payer: Self-pay | Admitting: Orthopaedic Surgery

## 2018-02-14 NOTE — Pre-Procedure Instructions (Signed)
SHIANNE ZEISER  02/14/2018      [place], [place] - [address] [facility] [address] [place] [address] Phone: [phone] Fax: [place], [place] - [address] AT [place] [address] Phone: [phone] Fax: [phone]  [place], [place] - [address] [place] [address] Phone: [phone] Fax: [place], [place] - [address] [place], [place] [address] Phone: [phone] Fax: [phone]  [facility] [place], [place] [address] Phone: [phone] Fax: [phone]    Your procedure is scheduled on December 10th.  Report to [facility] Admitting at 5:30 A.M.  Call this number if you have problems the morning of surgery:  [phone]   Remember:  Do not eat or drink after midnight.  7 days prior to surgery STOP taking any Aspirin(unless otherwise instructed by your surgeon), Aleve, Naproxen, Ibuprofen, Motrin, Advil, Goody's, BC's, all herbal medications, fish oil, and all vitamins        Do not wear jewelry, make-up or nail polish.  Do not wear lotions, powders, or perfumes, or deodorant.  Do not shave 48 hours prior to surgery.  Men may shave face and neck.  Do not bring valuables to the hospital.  [facility] is not responsible for any belongings or valuables.   [place]- Preparing For Surgery  Before surgery, you can play an important role. Because skin is not sterile, your skin needs to be as free of germs as possible. You can reduce the number of germs on your skin by washing with CHG (chlorahexidine gluconate) Soap before surgery.  CHG is an antiseptic cleaner which kills germs and bonds with  the skin to continue killing germs even after washing.    Oral Hygiene is also important to reduce your risk of infection.  Remember - BRUSH YOUR TEETH THE MORNING OF SURGERY WITH YOUR REGULAR TOOTHPASTE  Please do not use if you have an allergy to CHG or antibacterial soaps. If your skin becomes reddened/irritated stop using the CHG.  Do not shave (including legs and underarms) for at least 48 hours prior to first CHG shower. It is OK to shave your face.  Please follow these instructions carefully.   1. Shower the NIGHT BEFORE SURGERY and the MORNING OF SURGERY with CHG.   2. If you chose to wash your hair, wash your hair first as usual with your normal shampoo.  3. After you shampoo, rinse your hair and body thoroughly to remove the shampoo.  4. Use CHG as you would any other liquid soap. You can apply CHG directly to the skin and wash gently with a scrungie or a clean washcloth.   5. Apply the CHG Soap to your body ONLY FROM THE NECK DOWN.  Do not use on open wounds or open sores. Avoid contact with your eyes, ears, mouth and genitals (private parts). Wash Face and genitals (private parts)  with your normal soap.  6. Wash thoroughly, paying special attention to the area where your surgery will be performed.  7. Thoroughly rinse your body with warm water from the neck down.  8. DO NOT shower/wash with your normal soap after using  and rinsing off the CHG Soap.  9. Pat yourself dry with a CLEAN TOWEL.  10. Wear CLEAN PAJAMAS to bed the night before surgery, wear comfortable clothes the morning of surgery  11. Place CLEAN SHEETS on your bed the night of your first shower and DO NOT SLEEP WITH PETS.   Day of Surgery:  Do not apply any deodorants/lotions.  Please wear clean clothes to the hospital/surgery center.   Remember to brush your teeth WITH YOUR REGULAR TOOTHPASTE.   Contacts, dentures or bridgework may not be worn into surgery.  Leave your suitcase in the car.  After  surgery it may be brought to your room.  For patients admitted to the hospital, discharge time will be determined by your treatment team.  Patients discharged the day of surgery will not be allowed to drive home.   Please read over the following fact sheets that you were given. Coughing and Deep Breathing, MRSA Information and Surgical Site Infection Prevention

## 2018-02-15 ENCOUNTER — Encounter (HOSPITAL_COMMUNITY): Payer: Self-pay

## 2018-02-15 ENCOUNTER — Ambulatory Visit (HOSPITAL_COMMUNITY)
Admission: RE | Admit: 2018-02-15 | Discharge: 2018-02-15 | Disposition: A | Discharge status: Home / Self Care | Payer: PRIVATE HEALTH INSURANCE | Source: Ambulatory Visit | Attending: Orthopaedic Surgery | Admitting: Orthopaedic Surgery

## 2018-02-15 ENCOUNTER — Other Ambulatory Visit: Payer: Self-pay

## 2018-02-15 ENCOUNTER — Encounter (HOSPITAL_COMMUNITY)
Admission: RE | Admit: 2018-02-15 | Discharge: 2018-02-15 | Disposition: A | Discharge status: Home / Self Care | Payer: PRIVATE HEALTH INSURANCE | Source: Ambulatory Visit | Attending: Orthopaedic Surgery | Admitting: Orthopaedic Surgery

## 2018-02-15 DIAGNOSIS — Z01818 Encounter for other preprocedural examination: Secondary | ICD-10-CM | POA: Diagnosis present

## 2018-02-15 HISTORY — DX: Cardiac murmur, unspecified: R01.1

## 2018-02-15 LAB — URINALYSIS, ROUTINE W REFLEX MICROSCOPIC
BILIRUBIN URINE: NEGATIVE
Glucose, UA: NEGATIVE mg/dL
HGB URINE DIPSTICK: NEGATIVE
Ketones, ur: NEGATIVE mg/dL
Leukocytes, UA: NEGATIVE
Nitrite: NEGATIVE
PH: 8 (ref 5.0–8.0)
Protein, ur: NEGATIVE mg/dL
SPECIFIC GRAVITY, URINE: 1.009 (ref 1.005–1.030)

## 2018-02-15 LAB — CBC WITH DIFFERENTIAL/PLATELET
ABS IMMATURE GRANULOCYTES: 0.02 10*3/uL (ref 0.00–0.07)
Basophils Absolute: 0 10*3/uL (ref 0.0–0.1)
Basophils Relative: 1 %
EOS PCT: 1 %
Eosinophils Absolute: 0 10*3/uL (ref 0.0–0.5)
HEMATOCRIT: 43.3 % (ref 36.0–46.0)
HEMOGLOBIN: 14 g/dL (ref 12.0–15.0)
Immature Granulocytes: 0 %
LYMPHS ABS: 1 10*3/uL (ref 0.7–4.0)
Lymphocytes Relative: 18 %
MCH: 30.6 pg (ref 26.0–34.0)
MCHC: 32.3 g/dL (ref 30.0–36.0)
MCV: 94.5 fL (ref 80.0–100.0)
Monocytes Absolute: 0.5 10*3/uL (ref 0.1–1.0)
Monocytes Relative: 9 %
NEUTROS ABS: 4 10*3/uL (ref 1.7–7.7)
NEUTROS PCT: 71 %
NRBC: 0 % (ref 0.0–0.2)
Platelets: 182 10*3/uL (ref 150–400)
RBC: 4.58 MIL/uL (ref 3.87–5.11)
RDW: 12.6 % (ref 11.5–15.5)
WBC: 5.6 10*3/uL (ref 4.0–10.5)

## 2018-02-15 LAB — BASIC METABOLIC PANEL
ANION GAP: 7 (ref 5–15)
BUN: 12 mg/dL (ref 6–20)
CHLORIDE: 103 mmol/L (ref 98–111)
CO2: 27 mmol/L (ref 22–32)
CREATININE: 0.76 mg/dL (ref 0.44–1.00)
Calcium: 9.3 mg/dL (ref 8.9–10.3)
GFR calc non Af Amer: >60 mL/min (ref >60)
Glucose, Bld: 88 mg/dL (ref 70–99)
POTASSIUM: 3.6 mmol/L (ref 3.5–5.1)
SODIUM: 137 mmol/L (ref 135–145)

## 2018-02-15 LAB — TYPE AND SCREEN
ABO/RH(D): O NEG
Antibody Screen: NEGATIVE

## 2018-02-15 LAB — PROTIME-INR
INR: 0.99
PROTHROMBIN TIME: 13 s (ref 11.4–15.2)

## 2018-02-15 LAB — SURGICAL PCR SCREEN
MRSA, PCR: NEGATIVE
Staphylococcus aureus: NEGATIVE

## 2018-02-15 LAB — APTT: aPTT: 31 seconds (ref 24–36)

## 2018-02-15 LAB — ABO/RH: ABO/RH(D): O NEG

## 2018-02-15 NOTE — Progress Notes (Signed)
PCP - Dr. Billey Gosling  OB/GYN- Dr. Dian Queen- seen mostly   Cardiologist - Denies  Chest x-ray - 02/15/18  EKG - 02/15/18  Stress Test - Denies  ECHO - 12 years ago  Cardiac Cath - Denies  AICD- na PM- na LOOP- na  Sleep Study - Denies CPAP - None  LABS- 02/15/18: CBC w/D, BMP, PT, PTT, T/S, UA, PCR   ASA- Denies   Anesthesia- No  Pt denies having chest pain, sob, or fever at this time. All instructions explained to the pt, with a verbal understanding of the material. Pt agrees to go over the instructions while at home for a better understanding. The opportunity to ask questions was provided.

## 2018-02-21 NOTE — H&P (Signed)
TOTAL KNEE ADMISSION H&P  Patient is being admitted for right total knee arthroplasty.  Subjective:  Chief Complaint:right knee pain.  HPI: Rebekah Larson, 52 y.o. female, has a history of pain and functional disability in the right knee due to arthritis and has failed non-surgical conservative treatments for greater than 12 weeks to includeNSAID's and/or analgesics, corticosteriod injections, viscosupplementation injections, flexibility and strengthening excercises, weight reduction as appropriate and activity modification.  Onset of symptoms was gradual, starting 5 years ago with gradually worsening course since that time. The patient noted no past surgery on the right knee(s).  Patient currently rates pain in the right knee(s) at 10 out of 10 with activity. Patient has night pain, worsening of pain with activity and weight bearing, pain that interferes with activities of daily living, crepitus and joint swelling.  Patient has evidence of subchondral cysts, subchondral sclerosis, periarticular osteophytes and joint space narrowing by imaging studies. There is no active infection.  Patient Active Problem List   Diagnosis Date Noted  . Low back pain 09/06/2017  . Nonallopathic lesion of lumbosacral region 09/06/2017  . Nonallopathic lesion of sacral region 09/06/2017  . Nonallopathic lesion of thoracic region 09/06/2017  . Degenerative joint disease of knee, right 01/26/2017  . Acute medial meniscal tear, right, initial encounter 01/13/2017  . Chalazion of right lower eyelid 08/23/2016  . Meniscal cyst 06/17/2015  . MVP (mitral valve prolapse) 04/22/2015  . Genetic testing 06/10/2014  . Neoplasm of right breast, primary tumor staging category Tis: ductal carcinoma in situ (DCIS) 05/27/2014  . Osteopenia 10/09/2007   Past Medical History:  Diagnosis Date  . Breast cancer, right breast ([place]) 05/17/14   DUCTAL CARCINOMA IN SITU WITH CALCIFICATIONS  . Heart murmur   . History of kidney  stones   . Mitral valve prolapse    very mild, per pt.  . Neoplasm of right breast, primary tumor staging category Tis: ductal carcinoma in situ (DCIS) 05/2014  . Personal history of radiation therapy   . S/P radiation therapy 08/05/2014 through 09/16/2014   Right breast 4800 cGy in 24 sessions, right breast tumor bed boost 1200 cGy in 6 sessions, for a cumulative tumor bed dose of 6000 cGy in 30 sessions    . Shingles     Past Surgical History:  Procedure Laterality Date  . BREAST LUMPECTOMY     right lumpectomy 2016  . BREAST LUMPECTOMY WITH RADIOACTIVE SEED LOCALIZATION Right 07/04/2014   Procedure: BREAST LUMPECTOMY WITH RADIOACTIVE SEED LOCALIZATION;  Surgeon: Rolm Bookbinder, MD;  Location: [facility];  Service: General;  Laterality: Right;  . WISDOM TOOTH EXTRACTION      No current facility-administered medications for this encounter.    Current Outpatient Medications  Medication Sig Dispense Refill Last Dose  . calcium carbonate (TUMS SMOOTHIES) 750 MG chewable tablet Chew 1 tablet by mouth as needed for heartburn.     . cholecalciferol (VITAMIN D) 1000 UNITS tablet Take 1,000 Units by mouth daily. Reported on 04/22/2015   Taking  . Diclofenac Sodium 2 % SOLN Place 2 g onto the skin 2 (two) times daily. 112 g 3 Taking  . famotidine (PEPCID) 10 MG tablet Take 10 mg by mouth as needed for heartburn or indigestion.     . magnesium 30 MG tablet Take 30 mg by mouth 2 (two) times daily.   Taking  . meloxicam (MOBIC) 15 MG tablet TAKE 1 TABLET BY MOUTH ONCE DAILY 30 tablet 0 Taking  . ranitidine (ZANTAC) 150  MG capsule Take 150 mg by mouth as needed for heartburn.     . Vitamin D, Ergocalciferol, (DRISDOL) 50000 units CAPS capsule Take 50,000 Units by mouth every 7 (seven) days.   Taking   No Known Allergies  Social History   Tobacco Use  . Smoking status: Never Smoker  . Smokeless tobacco: Never Used  Substance Use Topics  . Alcohol use: Yes     Comment: 1-2 drinks/week    Family History  Problem Relation Age of Onset  . Colon cancer Father 38  . CAD Father 78       CABG x 2 - age 73s, 78s  . Hypertension Father   . Hyperlipidemia Father   . Breast cancer Mother 75       DCIS  . Hypertension Mother   . Cancer Paternal Grandfather 50       spinal tumor  . Breast cancer Other        MGM's sister dx over 51  . Breast cancer Other        MGF's sister dx over 42  . Breast cancer Sister   . CAD Paternal Aunt      Review of Systems  Musculoskeletal: Positive for joint pain.       Right knee  All other systems reviewed and are negative.   Objective:  Physical Exam  Constitutional: She is oriented to person, place, and time. She appears well-developed and well-nourished.  HENT:  Head: Normocephalic and atraumatic.  Eyes: Pupils are equal, round, and reactive to light.  Neck: Normal range of motion.  Cardiovascular: Normal rate.  Respiratory: Effort normal.  GI: Soft.  Musculoskeletal:  Right knee motion is about 5-130.  Her opposite knee extends fully.  She has no effusion.  She has medial joint line pain and some patellofemoral pain and some crepitation.  Hip motion is full and pain free and SLR is negative on both sides.  There is no palpable LAD behind either knee.  Sensation and motor function are intact on both sides and there are palpable pulses on both sides.    Neurological: She is alert and oriented to person, place, and time.  Skin: Skin is warm and dry.  Psychiatric: She has a normal mood and affect. Her behavior is normal. Judgment and thought content normal.    Vital signs in last 24 hours:    Labs:   Estimated body mass index is 22.78 kg/m as calculated from the following:   Height as of 02/15/18: 5\' 8"  (1.727 m).   Weight as of 02/15/18: 67.9 kg.   Imaging Review Plain radiographs demonstrate severe degenerative joint disease of the right knee(s). The overall alignment isneutral. The bone  quality appears to be good for age and reported activity level.   Preoperative templating of the joint replacement has been completed, documented, and submitted to the Operating Room personnel in order to optimize intra-operative equipment management.    Patient's anticipated LOS is less than 2 midnights, meeting these requirements: - Younger than 42 - Lives within 1 hour of care - Has a competent adult at home to recover with post-op recover - NO history of  - Chronic pain requiring opiods  - Diabetes  - Coronary Artery Disease  - Heart failure  - Heart attack  - Stroke  - DVT/VTE  - Cardiac arrhythmia  - Respiratory Failure/COPD  - Renal failure  - Anemia  - Advanced Liver disease  Assessment/Plan:  End stage primary arthritis, right knee  The patient history, physical examination, clinical judgment of the provider and imaging studies are consistent with end stage degenerative joint disease of the right knee(s) and total knee arthroplasty is deemed medically necessary. The treatment options including medical management, injection therapy arthroscopy and arthroplasty were discussed at length. The risks and benefits of total knee arthroplasty were presented and reviewed. The risks due to aseptic loosening, infection, stiffness, patella tracking problems, thromboembolic complications and other imponderables were discussed. The patient acknowledged the explanation, agreed to proceed with the plan and consent was signed. Patient is being admitted for inpatient treatment for surgery, pain control, PT, OT, prophylactic antibiotics, VTE prophylaxis, progressive ambulation and ADL's and discharge planning. The patient is planning to be discharged home with home health services

## 2018-02-27 MED ORDER — TRANEXAMIC ACID-NACL 1000-0.7 MG/100ML-% IV SOLN
1000.0000 mg | INTRAVENOUS | Status: AC
  Administered 2018-02-28: 1000 mg via INTRAVENOUS
  Filled 2018-02-27: qty 100

## 2018-02-27 MED ORDER — BUPIVACAINE LIPOSOME 1.3 % IJ SUSP
20.0000 mL | INTRAMUSCULAR | Status: DC
  Filled 2018-02-27: qty 20

## 2018-02-27 MED ORDER — TRANEXAMIC ACID 1000 MG/10ML IV SOLN
2000.0000 mg | INTRAVENOUS | Status: AC
  Administered 2018-02-28: 2000 mg via TOPICAL
  Filled 2018-02-27: qty 20

## 2018-02-28 ENCOUNTER — Ambulatory Visit (HOSPITAL_COMMUNITY): Payer: PRIVATE HEALTH INSURANCE | Admitting: Certified Registered Nurse Anesthetist

## 2018-02-28 ENCOUNTER — Encounter (HOSPITAL_COMMUNITY)
Admission: RE | Disposition: A | Discharge status: Home / Self Care | Payer: Self-pay | Source: Ambulatory Visit | Attending: Orthopaedic Surgery

## 2018-02-28 ENCOUNTER — Other Ambulatory Visit: Payer: Self-pay

## 2018-02-28 ENCOUNTER — Observation Stay (HOSPITAL_COMMUNITY)
Admission: RE | Admit: 2018-02-28 | Discharge: 2018-03-01 | Disposition: A | Discharge status: Home / Self Care | Payer: PRIVATE HEALTH INSURANCE | Source: Ambulatory Visit | Attending: Orthopaedic Surgery | Admitting: Orthopaedic Surgery

## 2018-02-28 ENCOUNTER — Encounter (HOSPITAL_COMMUNITY): Payer: Self-pay | Admitting: *Deleted

## 2018-02-28 DIAGNOSIS — Z79899 Other long term (current) drug therapy: Secondary | ICD-10-CM | POA: Insufficient documentation

## 2018-02-28 DIAGNOSIS — Z923 Personal history of irradiation: Secondary | ICD-10-CM | POA: Insufficient documentation

## 2018-02-28 DIAGNOSIS — I341 Nonrheumatic mitral (valve) prolapse: Secondary | ICD-10-CM | POA: Diagnosis not present

## 2018-02-28 DIAGNOSIS — Z791 Long term (current) use of non-steroidal anti-inflammatories (NSAID): Secondary | ICD-10-CM | POA: Insufficient documentation

## 2018-02-28 DIAGNOSIS — M1711 Unilateral primary osteoarthritis, right knee: Secondary | ICD-10-CM | POA: Diagnosis present

## 2018-02-28 HISTORY — PX: TOTAL KNEE ARTHROPLASTY: SHX125

## 2018-02-28 HISTORY — DX: Unspecified osteoarthritis, unspecified site: M19.90

## 2018-02-28 SURGERY — ARTHROPLASTY, KNEE, TOTAL
Anesthesia: Monitor Anesthesia Care | Site: Knee | Laterality: Right

## 2018-02-28 MED ORDER — KETOROLAC TROMETHAMINE 15 MG/ML IJ SOLN
15.0000 mg | Freq: Four times a day (QID) | INTRAMUSCULAR | Status: AC
  Administered 2018-02-28 – 2018-03-01 (×4): 15 mg via INTRAVENOUS
  Filled 2018-02-28 (×3): qty 1

## 2018-02-28 MED ORDER — BUPIVACAINE IN DEXTROSE 0.75-8.25 % IT SOLN
INTRATHECAL | Status: DC | PRN
  Administered 2018-02-28: 1.8 mL via INTRATHECAL

## 2018-02-28 MED ORDER — EPHEDRINE SULFATE-NACL 50-0.9 MG/10ML-% IV SOSY
PREFILLED_SYRINGE | INTRAVENOUS | Status: DC | PRN
  Administered 2018-02-28: 5 mg via INTRAVENOUS

## 2018-02-28 MED ORDER — ACETAMINOPHEN 325 MG PO TABS: 325.0000 mg | ORAL_TABLET | Freq: Four times a day (QID) | ORAL | Status: DC | PRN

## 2018-02-28 MED ORDER — CHLORHEXIDINE GLUCONATE 4 % EX LIQD: 60.0000 mL | Freq: Once | CUTANEOUS | Status: DC

## 2018-02-28 MED ORDER — OXYCODONE HCL 5 MG PO TABS: 5.0000 mg | ORAL_TABLET | Freq: Once | ORAL | Status: DC | PRN

## 2018-02-28 MED ORDER — MIDAZOLAM HCL 5 MG/5ML IJ SOLN
INTRAMUSCULAR | Status: DC | PRN
  Administered 2018-02-28: 2 mg via INTRAVENOUS

## 2018-02-28 MED ORDER — ASPIRIN EC 325 MG PO TBEC
325.0000 mg | DELAYED_RELEASE_TABLET | Freq: Two times a day (BID) | ORAL | Status: DC
  Administered 2018-03-01: 325 mg via ORAL
  Filled 2018-02-28: qty 1

## 2018-02-28 MED ORDER — BUPIVACAINE HCL 0.5 % IJ SOLN
INTRAMUSCULAR | Status: AC
  Filled 2018-02-28: qty 1

## 2018-02-28 MED ORDER — KETOROLAC TROMETHAMINE 15 MG/ML IJ SOLN
INTRAMUSCULAR | Status: AC
  Filled 2018-02-28: qty 1

## 2018-02-28 MED ORDER — MORPHINE SULFATE (PF) 2 MG/ML IV SOLN: 0.5000 mg | INTRAVENOUS | Status: DC | PRN

## 2018-02-28 MED ORDER — MIDAZOLAM HCL 2 MG/2ML IJ SOLN
INTRAMUSCULAR | Status: AC
  Filled 2018-02-28: qty 2

## 2018-02-28 MED ORDER — METHOCARBAMOL 500 MG PO TABS: 500.0000 mg | ORAL_TABLET | Freq: Four times a day (QID) | ORAL | Status: DC | PRN

## 2018-02-28 MED ORDER — 0.9 % SODIUM CHLORIDE (POUR BTL) OPTIME
TOPICAL | Status: DC | PRN
  Administered 2018-02-28: 1000 mL

## 2018-02-28 MED ORDER — MENTHOL 3 MG MT LOZG: 1.0000 | LOZENGE | OROMUCOSAL | Status: DC | PRN

## 2018-02-28 MED ORDER — LACTATED RINGERS IV SOLN
INTRAVENOUS | Status: DC
  Administered 2018-02-28: 07:00:00 via INTRAVENOUS

## 2018-02-28 MED ORDER — METHOCARBAMOL 1000 MG/10ML IJ SOLN
500.0000 mg | Freq: Four times a day (QID) | INTRAVENOUS | Status: DC | PRN
  Filled 2018-02-28: qty 5

## 2018-02-28 MED ORDER — ACETAMINOPHEN 500 MG PO TABS
500.0000 mg | ORAL_TABLET | Freq: Four times a day (QID) | ORAL | Status: AC
  Filled 2018-02-28: qty 1

## 2018-02-28 MED ORDER — FENTANYL CITRATE (PF) 250 MCG/5ML IJ SOLN
INTRAMUSCULAR | Status: AC
  Filled 2018-02-28: qty 5

## 2018-02-28 MED ORDER — DOCUSATE SODIUM 100 MG PO CAPS
100.0000 mg | ORAL_CAPSULE | Freq: Two times a day (BID) | ORAL | Status: DC
  Administered 2018-02-28 – 2018-03-01 (×2): 100 mg via ORAL
  Filled 2018-02-28 (×2): qty 1

## 2018-02-28 MED ORDER — SODIUM CHLORIDE 0.9 % IR SOLN
Status: DC | PRN
  Administered 2018-02-28: 3000 mL

## 2018-02-28 MED ORDER — FENTANYL CITRATE (PF) 100 MCG/2ML IJ SOLN
INTRAMUSCULAR | Status: DC | PRN
  Administered 2018-02-28 (×2): 50 ug via INTRAVENOUS

## 2018-02-28 MED ORDER — FENTANYL CITRATE (PF) 100 MCG/2ML IJ SOLN
INTRAMUSCULAR | Status: AC
  Filled 2018-02-28: qty 2

## 2018-02-28 MED ORDER — DIPHENHYDRAMINE HCL 12.5 MG/5ML PO ELIX: 12.5000 mg | ORAL_SOLUTION | ORAL | Status: DC | PRN

## 2018-02-28 MED ORDER — BUPIVACAINE LIPOSOME 1.3 % IJ SUSP
INTRAMUSCULAR | Status: DC | PRN
  Administered 2018-02-28: 20 mL

## 2018-02-28 MED ORDER — HYDROCODONE-ACETAMINOPHEN 5-325 MG PO TABS
1.0000 | ORAL_TABLET | ORAL | Status: DC | PRN
  Administered 2018-03-01: 1 via ORAL
  Filled 2018-02-28: qty 2

## 2018-02-28 MED ORDER — DEXAMETHASONE SODIUM PHOSPHATE 10 MG/ML IJ SOLN
INTRAMUSCULAR | Status: DC | PRN
  Administered 2018-02-28: 10 mg via INTRAVENOUS

## 2018-02-28 MED ORDER — HYDROCODONE-ACETAMINOPHEN 7.5-325 MG PO TABS
1.0000 | ORAL_TABLET | ORAL | Status: DC | PRN
  Administered 2018-02-28 (×2): 1 via ORAL
  Administered 2018-02-28: 2 via ORAL
  Administered 2018-03-01: 1 via ORAL
  Filled 2018-02-28 (×3): qty 1
  Filled 2018-02-28: qty 2

## 2018-02-28 MED ORDER — EPINEPHRINE PF 1 MG/ML IJ SOLN
INTRAMUSCULAR | Status: AC
  Filled 2018-02-28: qty 2

## 2018-02-28 MED ORDER — BISACODYL 5 MG PO TBEC: 5.0000 mg | DELAYED_RELEASE_TABLET | Freq: Every day | ORAL | Status: DC | PRN

## 2018-02-28 MED ORDER — PROPOFOL 500 MG/50ML IV EMUL
INTRAVENOUS | Status: DC | PRN
  Administered 2018-02-28: 75 ug/kg/min via INTRAVENOUS

## 2018-02-28 MED ORDER — TRANEXAMIC ACID-NACL 1000-0.7 MG/100ML-% IV SOLN
1000.0000 mg | Freq: Once | INTRAVENOUS | Status: AC
  Administered 2018-02-28: 1000 mg via INTRAVENOUS
  Filled 2018-02-28: qty 100

## 2018-02-28 MED ORDER — METOCLOPRAMIDE HCL 5 MG PO TABS: 5.0000 mg | ORAL_TABLET | Freq: Three times a day (TID) | ORAL | Status: DC | PRN

## 2018-02-28 MED ORDER — ONDANSETRON HCL 4 MG/2ML IJ SOLN
INTRAMUSCULAR | Status: DC | PRN
  Administered 2018-02-28: 4 mg via INTRAVENOUS

## 2018-02-28 MED ORDER — PROPOFOL 10 MG/ML IV BOLUS
INTRAVENOUS | Status: DC | PRN
  Administered 2018-02-28: 20 mg via INTRAVENOUS

## 2018-02-28 MED ORDER — SODIUM CHLORIDE 0.9 % IV SOLN
INTRAVENOUS | Status: DC | PRN
  Administered 2018-02-28: 20 ug/min via INTRAVENOUS

## 2018-02-28 MED ORDER — BUPIVACAINE-EPINEPHRINE (PF) 0.25% -1:200000 IJ SOLN
INTRAMUSCULAR | Status: AC
  Filled 2018-02-28: qty 30

## 2018-02-28 MED ORDER — PROPOFOL 10 MG/ML IV BOLUS
INTRAVENOUS | Status: AC
  Filled 2018-02-28: qty 20

## 2018-02-28 MED ORDER — METOCLOPRAMIDE HCL 5 MG/ML IJ SOLN: 5.0000 mg | Freq: Three times a day (TID) | INTRAMUSCULAR | Status: DC | PRN

## 2018-02-28 MED ORDER — SODIUM CHLORIDE 0.9% FLUSH
INTRAVENOUS | Status: DC | PRN
  Administered 2018-02-28: 30 mL

## 2018-02-28 MED ORDER — CEFAZOLIN SODIUM-DEXTROSE 2-4 GM/100ML-% IV SOLN
2.0000 g | Freq: Four times a day (QID) | INTRAVENOUS | Status: AC
  Administered 2018-02-28 (×2): 2 g via INTRAVENOUS
  Filled 2018-02-28 (×2): qty 100

## 2018-02-28 MED ORDER — ALUM & MAG HYDROXIDE-SIMETH 200-200-20 MG/5ML PO SUSP: 30.0000 mL | ORAL | Status: DC | PRN

## 2018-02-28 MED ORDER — ROPIVACAINE HCL 7.5 MG/ML IJ SOLN
INTRAMUSCULAR | Status: DC | PRN
  Administered 2018-02-28: 20 mL via PERINEURAL

## 2018-02-28 MED ORDER — ONDANSETRON HCL 4 MG/2ML IJ SOLN: 4.0000 mg | Freq: Four times a day (QID) | INTRAMUSCULAR | Status: DC | PRN

## 2018-02-28 MED ORDER — BUPIVACAINE-EPINEPHRINE 0.25% -1:200000 IJ SOLN
INTRAMUSCULAR | Status: DC | PRN
  Administered 2018-02-28: 30 mL

## 2018-02-28 MED ORDER — ONDANSETRON HCL 4 MG PO TABS: 4.0000 mg | ORAL_TABLET | Freq: Four times a day (QID) | ORAL | Status: DC | PRN

## 2018-02-28 MED ORDER — FENTANYL CITRATE (PF) 100 MCG/2ML IJ SOLN
25.0000 ug | INTRAMUSCULAR | Status: DC | PRN
  Administered 2018-02-28: 25 ug via INTRAVENOUS

## 2018-02-28 MED ORDER — PHENOL 1.4 % MT LIQD: 1.0000 | OROMUCOSAL | Status: DC | PRN

## 2018-02-28 MED ORDER — LACTATED RINGERS IV SOLN: INTRAVENOUS | Status: DC

## 2018-02-28 MED ORDER — FAMOTIDINE 10 MG PO TABS: 10.0000 mg | ORAL_TABLET | Freq: Two times a day (BID) | ORAL | Status: DC | PRN

## 2018-02-28 MED ORDER — CEFAZOLIN SODIUM-DEXTROSE 2-4 GM/100ML-% IV SOLN
2.0000 g | INTRAVENOUS | Status: AC
  Administered 2018-02-28: 2 g via INTRAVENOUS
  Filled 2018-02-28: qty 100

## 2018-02-28 MED ORDER — OXYCODONE HCL 5 MG/5ML PO SOLN: 5.0000 mg | Freq: Once | ORAL | Status: DC | PRN

## 2018-02-28 SURGICAL SUPPLY — 54 items
ATTUNE PS FEM RT SZ 5 CEM KNEE (Femur) ×3 IMPLANT
ATTUNE PSRP INSR SZ5 6 KNEE (Insert) ×2 IMPLANT
ATTUNE PSRP INSR SZ5 6MM KNEE (Insert) ×1 IMPLANT
BAG DECANTER FOR FLEXI CONT (MISCELLANEOUS) ×3 IMPLANT
BANDAGE ESMARK 6X9 LF (GAUZE/BANDAGES/DRESSINGS) ×1 IMPLANT
BASE TIBIAL ROT PLAT SZ 5 KNEE (Knees) ×1 IMPLANT
BLADE SAGITTAL 25.0X1.19X90 (BLADE) ×2 IMPLANT
BLADE SAGITTAL 25.0X1.19X90MM (BLADE) ×1
BLADE SAW SGTL 13.0X1.19X90.0M (BLADE) ×3 IMPLANT
BNDG ELASTIC 6X10 VLCR STRL LF (GAUZE/BANDAGES/DRESSINGS) ×3 IMPLANT
BNDG ESMARK 6X9 LF (GAUZE/BANDAGES/DRESSINGS) ×3
BOWL SMART MIX CTS (DISPOSABLE) ×3 IMPLANT
CEMENT HV SMART SET (Cement) ×6 IMPLANT
COVER SURGICAL LIGHT HANDLE (MISCELLANEOUS) ×3 IMPLANT
COVER WAND RF STERILE (DRAPES) ×3 IMPLANT
CUFF TOURNIQUET SINGLE 34IN LL (TOURNIQUET CUFF) ×3 IMPLANT
DRAPE EXTREMITY T 121X128X90 (DRAPE) ×3 IMPLANT
DRAPE HALF SHEET 40X57 (DRAPES) ×6 IMPLANT
DRAPE U-SHAPE 47X51 STRL (DRAPES) ×3 IMPLANT
DRSG AQUACEL AG ADV 3.5X10 (GAUZE/BANDAGES/DRESSINGS) ×3 IMPLANT
DURAPREP 26ML APPLICATOR (WOUND CARE) ×6 IMPLANT
ELECT REM PT RETURN 9FT ADLT (ELECTROSURGICAL) ×3
ELECTRODE REM PT RTRN 9FT ADLT (ELECTROSURGICAL) ×1 IMPLANT
GLOVE BIO SURGEON STRL SZ8 (GLOVE) ×6 IMPLANT
GLOVE BIOGEL PI IND STRL 7.0 (GLOVE) ×1 IMPLANT
GLOVE BIOGEL PI IND STRL 8 (GLOVE) ×2 IMPLANT
GLOVE BIOGEL PI INDICATOR 7.0 (GLOVE) ×2
GLOVE BIOGEL PI INDICATOR 8 (GLOVE) ×4
GOWN STRL REUS W/ TWL LRG LVL3 (GOWN DISPOSABLE) ×2 IMPLANT
GOWN STRL REUS W/ TWL XL LVL3 (GOWN DISPOSABLE) ×2 IMPLANT
GOWN STRL REUS W/TWL LRG LVL3 (GOWN DISPOSABLE) ×4
GOWN STRL REUS W/TWL XL LVL3 (GOWN DISPOSABLE) ×4
HANDPIECE INTERPULSE COAX TIP (DISPOSABLE) ×2
HOOD PEEL AWAY FACE SHEILD DIS (HOOD) ×9 IMPLANT
KIT BASIN OR (CUSTOM PROCEDURE TRAY) ×3 IMPLANT
KIT TURNOVER KIT B (KITS) ×3 IMPLANT
MANIFOLD NEPTUNE II (INSTRUMENTS) ×3 IMPLANT
NEEDLE HYPO 21X1 ECLIPSE (NEEDLE) IMPLANT
NS IRRIG 1000ML POUR BTL (IV SOLUTION) ×3 IMPLANT
PACK TOTAL JOINT (CUSTOM PROCEDURE TRAY) ×3 IMPLANT
PAD ARMBOARD 7.5X6 YLW CONV (MISCELLANEOUS) ×9 IMPLANT
PATELLA MEDIAL ATTUN 35MM KNEE (Knees) ×3 IMPLANT
PIN STEINMAN FIXATION KNEE (PIN) ×3 IMPLANT
SET HNDPC FAN SPRY TIP SCT (DISPOSABLE) ×1 IMPLANT
SUT VIC AB 0 CT1 27 (SUTURE) ×4
SUT VIC AB 0 CT1 27XBRD ANBCTR (SUTURE) ×2 IMPLANT
SUT VIC AB 2-0 CT1 27 (SUTURE) ×4
SUT VIC AB 2-0 CT1 TAPERPNT 27 (SUTURE) ×2 IMPLANT
SUT VIC AB 3-0 FS2 27 (SUTURE) ×6 IMPLANT
SUT VLOC 180 0 24IN GS25 (SUTURE) ×3 IMPLANT
SYR 50ML LL SCALE MARK (SYRINGE) ×3 IMPLANT
TIBIAL BASE ROT PLAT SZ 5 KNEE (Knees) ×3 IMPLANT
TOWEL OR 17X24 6PK STRL BLUE (TOWEL DISPOSABLE) ×3 IMPLANT
TOWEL OR 17X26 10 PK STRL BLUE (TOWEL DISPOSABLE) ×3 IMPLANT

## 2018-02-28 NOTE — Anesthesia Procedure Notes (Signed)
Spinal  Patient location during procedure: OR Start time: 02/28/2018 7:30 AM End time: 02/28/2018 7:34 AM Staffing Anesthesiologist: Albertha Ghee, MD Performed: anesthesiologist  Preanesthetic Checklist Completed: patient identified, surgical consent, pre-op evaluation, timeout performed, IV checked, risks and benefits discussed and monitors and equipment checked Spinal Block Patient position: sitting Prep: Betadine Patient monitoring: cardiac monitor, continuous pulse ox and blood pressure Approach: midline Location: L3-4 Injection technique: single-shot Needle Needle type: Pencan  Needle gauge: 24 G Needle length: 9 cm Assessment Sensory level: T10 Additional Notes Functioning IV was confirmed and monitors were applied. Sterile prep and drape, including hand hygiene and sterile gloves were used. The patient was positioned and the spine was prepped. The skin was anesthetized with lidocaine.  Free flow of clear CSF was obtained prior to injecting local anesthetic into the CSF.  The spinal needle aspirated freely following injection.  The needle was carefully withdrawn.  The patient tolerated the procedure well.

## 2018-02-28 NOTE — Care Management Note (Signed)
Case Management Note  Patient Details  Name: Rebekah Larson MRN: [id number] Date of Birth: 09-22-1965  Subjective/Objective:                    Action/Plan:  Discussed discharge planning with patient at bedside. Confirmed face sheet information. Patient was re arranged through surgeon's office for Home health PT through [facility]. Same explained to patient. Also provided list list for home health agencies for her zip code with ratings.   Patient voices understanding of above. Choice is [facility], however, patient is hopeful she can "skip home health PT and do Outpatient PT". PT getting ready to work with patient now. Explained to patient will await PT recommendations and MD order.   Patient voiced understanding.  Will continue to follow.   3 in 1 and walker ordered through [facility] with [facility].    Expected Discharge Date:                  Expected Discharge Plan:  [place]  In-House Referral:     Discharge planning Services  CM Consult  Post Acute Care Choice:  Durable Medical Equipment, Home Health Choice offered to:  Patient  DME Arranged:  3-N-1, Walker rolling DME Agency:  [place]:  PT [place]:  [place]  Status of Service:  In process, will continue to follow  If discussed at Long Length of Stay Meetings, dates discussed:    Additional Comments:  Marilu Favre, RN 02/28/2018, 3:09 PM

## 2018-02-28 NOTE — Anesthesia Preprocedure Evaluation (Signed)
Anesthesia Evaluation  Patient identified by MRN, date of birth, ID band Patient awake    Reviewed: Allergy & Precautions, H&P , NPO status , Patient's Chart, lab work & pertinent test results  Airway Mallampati: II   Neck ROM: full    Dental   Pulmonary neg pulmonary ROS,    breath sounds clear to auscultation       Cardiovascular + Valvular Problems/Murmurs MVP  Rhythm:regular Rate:Normal     Neuro/Psych    GI/Hepatic   Endo/Other    Renal/GU      Musculoskeletal  (+) Arthritis ,   Abdominal   Peds  Hematology   Anesthesia Other Findings   Reproductive/Obstetrics                             Anesthesia Physical Anesthesia Plan  ASA: II  Anesthesia Plan: Spinal and MAC   Post-op Pain Management:  Regional for Post-op pain   Induction: Intravenous  PONV Risk Score and Plan: 2 and Ondansetron, Propofol infusion and Treatment may vary due to age or medical condition  Airway Management Planned: Simple Face Mask  Additional Equipment:   Intra-op Plan:   Post-operative Plan:   Informed Consent: I have reviewed the patients History and Physical, chart, labs and discussed the procedure including the risks, benefits and alternatives for the proposed anesthesia with the patient or authorized representative who has indicated his/her understanding and acceptance.     Plan Discussed with: CRNA, Surgeon and Anesthesiologist  Anesthesia Plan Comments:         Anesthesia Quick Evaluation

## 2018-02-28 NOTE — Progress Notes (Signed)
Orthopedic Tech Progress Note Patient Details:  Rebekah Larson 21-Jan-1966 [id number] Pt refused OHF     Post Interventions Patient Tolerated: Refused intervention   Braulio Bosch 02/28/2018, 4:02 PM

## 2018-02-28 NOTE — Interval H&P Note (Signed)
History and Physical Interval Note:  02/28/2018 7:17 AM  Rebekah Larson  has presented today for surgery, with the diagnosis of RIGHT KNEE DEGENERATIVE JOINT DISEASE  The various methods of treatment have been discussed with the patient and family. After consideration of risks, benefits and other options for treatment, the patient has consented to  Procedure(s): TOTAL KNEE ARTHROPLASTY (Right) as a surgical intervention .  The patient's history has been reviewed, patient examined, no change in status, stable for surgery.  I have reviewed the patient's chart and labs.  Questions were answered to the patient's satisfaction.     Linsay Vogt G

## 2018-02-28 NOTE — Social Work (Signed)
CSW acknowledging consult for SNF placement. Will follow for therapy recommendations.   Neria Procter, MSW, LCSWA [place] [facility] Social Work [phone]   

## 2018-02-28 NOTE — Anesthesia Procedure Notes (Signed)
Procedure Name: MAC Date/Time: 02/28/2018 7:45 AM Performed by: Julieta Bellini, CRNA Pre-anesthesia Checklist: Patient identified, Emergency Drugs available, Suction available and Patient being monitored Patient Re-evaluated:Patient Re-evaluated prior to induction Oxygen Delivery Method: Simple face mask

## 2018-02-28 NOTE — Anesthesia Procedure Notes (Signed)
Anesthesia Regional Block: Adductor canal block   Pre-Anesthetic Checklist: ,, timeout performed, Correct Patient, Correct Site, Correct Laterality, Correct Procedure, Correct Position, site marked, Risks and benefits discussed,  Surgical consent,  Pre-op evaluation,  At surgeon's request and post-op pain management  Laterality: Right  Prep: chloraprep       Needles:  Injection technique: Single-shot  Needle Type: Echogenic Needle     Needle Length: 9cm  Needle Gauge: 21     Additional Needles:   Narrative:  Start time: 02/28/2018 7:05 AM End time: 02/28/2018 7:15 AM Injection made incrementally with aspirations every 5 mL.  Performed by: Personally  Anesthesiologist: Albertha Ghee, MD  Additional Notes: Pt tolerated the procedure well.

## 2018-02-28 NOTE — Transfer of Care (Signed)
Immediate Anesthesia Transfer of Care Note  Patient: Rebekah Larson  Procedure(s) Performed: TOTAL KNEE ARTHROPLASTY (Right Knee)  Patient Location: PACU  Anesthesia Type:Spinal and MAC combined with regional for post-op pain  Level of Consciousness: awake, alert , oriented and patient cooperative  Airway & Oxygen Therapy: Patient Spontanous Breathing  Post-op Assessment: Report given to RN, Post -op Vital signs reviewed and stable and Patient moving all extremities X 4  Post vital signs: Reviewed and stable  Last Vitals:  Vitals Value Taken Time  BP 122/74 02/28/2018  9:41 AM  Temp    Pulse 52 02/28/2018  9:48 AM  Resp 17 02/28/2018  9:48 AM  SpO2 100 % 02/28/2018  9:48 AM  Vitals shown include unvalidated device data.  Last Pain:  Vitals:   02/28/18 0624  TempSrc:   PainSc: 0-No pain      Patients Stated Pain Goal: 3 (31/43/88 8757)  Complications: No apparent anesthesia complications

## 2018-02-28 NOTE — Op Note (Signed)
PREOP DIAGNOSIS: DJD RIGHT KNEE POSTOP DIAGNOSIS: same PROCEDURE: RIGHT TKR ANESTHESIA: Spinal and MAC ATTENDING SURGEON: Briannia Laba G ASSISTANT: Loni Dolly PA  INDICATIONS FOR PROCEDURE: Rebekah Larson is a 52 y.o. female who has struggled for a long time with pain due to degenerative arthritis of the right knee.  The patient has failed many conservative non-operative measures and at this point has pain which limits the ability to sleep and walk.  The patient is offered total knee replacement.  Informed operative consent was obtained after discussion of possible risks of anesthesia, infection, neurovascular injury, DVT, and death.  The importance of the post-operative rehabilitation protocol to optimize result was stressed extensively with the patient.  SUMMARY OF FINDINGS AND PROCEDURE:  Rebekah Larson was taken to the operative suite where under the above anesthesia a right knee replacement was performed.  There were advanced degenerative changes and the bone quality was excellent.  We used the DePuy Attune system and placed size 5 femur, 5 tibia, 35 mm all polyethylene patella, and a size 6 mm spacer.  Loni Dolly PA-C assisted throughout and was invaluable to the completion of the case in that he helped retract and maintain exposure while I placed components.  He also helped close thereby minimizing OR time.  The patient was admitted for appropriate post-op care to include perioperative antibiotics and mechanical and pharmacologic measures for DVT prophylaxis.  DESCRIPTION OF PROCEDURE:  Rebekah Larson was taken to the operative suite where the above anesthesia was applied.  The patient was positioned supine and prepped and draped in normal sterile fashion.  An appropriate time out was performed.  After the administration of kefzol pre-op antibiotic the leg was elevated and exsanguinated and a tourniquet inflated. A standard longitudinal incision was made on the anterior knee.   Dissection was carried down to the extensor mechanism.  All appropriate anti-infective measures were used including the pre-operative antibiotic, betadine impregnated drape, and closed hooded exhaust systems for each member of the surgical team.  A medial parapatellar incision was made in the extensor mechanism and the knee cap flipped and the knee flexed.  Some residual meniscal tissues were removed along with any remaining ACL/PCL tissue.  A guide was placed on the tibia and a flat cut was made on it's superior surface.  An intramedullary guide was placed in the femur and was utilized to make anterior and posterior cuts creating an appropriate flexion gap.  A second intramedullary guide was placed in the femur to make a distal cut properly balancing the knee with an extension gap equal to the flexion gap.  The three bones sized to the above mentioned sizes and the appropriate guides were placed and utilized.  A trial reduction was done and the knee easily came to full extension and the patella tracked well on flexion.  The trial components were removed and all bones were cleaned with pulsatile lavage and then dried thoroughly.  Cement was mixed and was pressurized onto the bones followed by placement of the aforementioned components.  Excess cement was trimmed and pressure was held on the components until the cement had hardened.  The tourniquet was deflated and a small amount of bleeding was controlled with cautery and pressure.  The knee was irrigated thoroughly.  The extensor mechanism was re-approximated with V-loc suture in running fashion.  The knee was flexed and the repair was solid.  The subcutaneous tissues were re-approximated with #0 and #2-0 vicryl and the skin closed with a  subcuticular stitch and steristrips.  A sterile dressing was applied.  Intraoperative fluids, EBL, and tourniquet time can be obtained from anesthesia records.  DISPOSITION:  The patient was taken to recovery room in stable  condition and admitted for appropriate post-op care to include peri-operative antibiotic and DVT prophylaxis with mechanical and pharmacologic measures.  Aristea Posada G 02/28/2018, 9:15 AM

## 2018-02-28 NOTE — Evaluation (Signed)
Physical Therapy Evaluation Patient Details Name: Rebekah Larson MRN: [id number] DOB: [birth year] Today's Date: 02/28/2018   History of Present Illness  Pt is a 52 y.o. female s/p R TKA on 02/28/18. PMH includes breast CA (s/p radiation), arthritis.  Clinical Impression  Pt presents with an overall decrease in functional mobility secondary to above. PTA, pt independent, works and very physically active; will have initial 24/7 support upon return home. Today, pt able to transfer and ambulate with RW and supervision for safety. Educ on precautions, positioning, therex (HEP handout provided) and importance of mobility. Overall, pt moving very well; motivated to start with outpatient PT instead of HHPT services. Pt would benefit from continued acute PT services to maximize functional mobility and independence prior to d/c with outpatient PT.     Follow Up Recommendations Follow surgeon's recommendation for DC plan and follow-up therapies;Supervision for mobility/OOB    Equipment Recommendations  Rolling walker with 5" wheels;3in1 (PT)    Recommendations for Other Services       Precautions / Restrictions Precautions Precautions: Fall;Knee Precaution Booklet Issued: Yes (comment) Precaution Comments: Verbally reviewed precautions Restrictions Weight Bearing Restrictions: Yes RLE Weight Bearing: Weight bearing as tolerated      Mobility  Bed Mobility Overal bed mobility: Modified Independent             General bed mobility comments: Increased time and effort; reliant on BUE support to assist RLE to EOB  Transfers Overall transfer level: Needs assistance Equipment used: Rolling walker (2 wheeled) Transfers: Sit to/from Stand Sit to Stand: Supervision         General transfer comment: Stood first trial pulling on RW; stood second trial with correct hand placement pushing from bed. Supervision for safety. Cues to encourage R knee flexion upon sitting    Ambulation/Gait Ambulation/Gait assistance: Min guard;Supervision Gait Distance (Feet): 100 Feet Assistive device: Rolling walker (2 wheeled) Gait Pattern/deviations: Step-through pattern;Decreased stride length;Decreased weight shift to right;Antalgic Gait velocity: Decreased Gait velocity interpretation: 1.31 - 2.62 ft/sec, indicative of limited community ambulator General Gait Details: Slow, antalgic amb with initial min guard, progressing to supervision. Cues to increase RLE step length and heel to toe gait pattern; cues to increase RLE WBAT   Stairs            Wheelchair Mobility    Modified Rankin (Stroke Patients Only)       Balance Overall balance assessment: Needs assistance   Sitting balance-Leahy Scale: Good Sitting balance - Comments: Able to don bilateral socks sitting EOB     Standing balance-Leahy Scale: Fair Standing balance comment: Can static stand without UE support                             Pertinent Vitals/Pain Pain Assessment: 0-10 Pain Score: 4  Pain Location: R knee Pain Descriptors / Indicators: Sore;Guarding;Discomfort Pain Intervention(s): Monitored during session;Limited activity within patient's tolerance;Premedicated before session    [place] expects to be discharged to:: Private residence Living Arrangements: Spouse/significant other Available Help at Discharge: Family;Available 24 hours/day Type of Home: House Home Access: Stairs to enter Entrance Stairs-Rails: None Entrance Stairs-Number of Steps: 1 Home Layout: Two level;Able to live on main level with bedroom/bathroom Home Equipment: None      Prior Function Level of Independence: Independent         Comments: Works as Proofreader; used to be Education officer, museum. Active (enjoys cycling classes). Drives  Hand Dominance        Extremity/Trunk Assessment   Upper Extremity Assessment Upper Extremity Assessment: Overall WFL for  tasks assessed    Lower Extremity Assessment Lower Extremity Assessment: RLE deficits/detail RLE Deficits / Details: Hip flexion 3/5, knee ext <3/5, ankle WFL RLE: Unable to fully assess due to pain;Unable to fully assess due to immobilization    Cervical / Trunk Assessment Cervical / Trunk Assessment: Normal  Communication   Communication: No difficulties  Cognition Arousal/Alertness: Awake/alert Behavior During Therapy: WFL for tasks assessed/performed Overall Cognitive Status: Within Functional Limits for tasks assessed                                        General Comments General comments (skin integrity, edema, etc.): C/o nausea upon returning to sit which subsided with seated rest    Exercises Other Exercises Other Exercises: Medbridge Access Code: [email or web address] - handout including SLR, hip ABD, supine heel slides (with belt), LAQ, seated knee flexion stretch; discussed progression to standing R hip ABD/ext/flex with BUE support on counter   Assessment/Plan    PT Assessment Patient needs continued PT services  PT Problem List Decreased strength;Decreased range of motion;Decreased activity tolerance;Decreased balance;Decreased mobility;Decreased knowledge of use of DME;Decreased knowledge of precautions;Pain       PT Treatment Interventions DME instruction;Gait training;Stair training;Functional mobility training;Therapeutic activities;Therapeutic exercise;Balance training;Patient/family education    PT Goals (Current goals can be found in the Care Plan section)  Acute Rehab PT Goals Patient Stated Goal: Return home and start with outpatient PT (instead of doing HH first) PT Goal Formulation: With patient Time For Goal Achievement: 03/14/18 Potential to Achieve Goals: Good    Frequency 7X/week   Barriers to discharge        Co-evaluation               AM-PAC PT "6 Clicks" Mobility  Outcome Measure Help needed turning from your back to your  side while in a flat bed without using bedrails?: None Help needed moving from lying on your back to sitting on the side of a flat bed without using bedrails?: None Help needed moving to and from a bed to a chair (including a wheelchair)?: A Little Help needed standing up from a chair using your arms (e.g., wheelchair or bedside chair)?: A Little Help needed to walk in hospital room?: A Little Help needed climbing 3-5 steps with a railing? : A Little 6 Click Score: 20    End of Session Equipment Utilized During Treatment: Gait belt Activity Tolerance: Patient tolerated treatment well Patient left: in chair;with call bell/phone within reach Nurse Communication: Mobility status PT Visit Diagnosis: Other abnormalities of gait and mobility (R26.89);Pain Pain - Right/Left: Right Pain - part of body: Knee    Time: 3546-5681 PT Time Calculation (min) (ACUTE ONLY): 49 min   Charges:   PT Evaluation $PT Eval Low Complexity: 1 Low PT Treatments $Gait Training: 8-22 mins $Therapeutic Exercise: 8-22 mins      Mabeline Caras, PT, DPT Acute Rehabilitation Services  Pager [phone] Office [phone]  Derry Lory 02/28/2018, 4:22 PM

## 2018-03-01 ENCOUNTER — Telehealth: Payer: Self-pay

## 2018-03-01 ENCOUNTER — Encounter (HOSPITAL_COMMUNITY): Payer: Self-pay | Admitting: Orthopaedic Surgery

## 2018-03-01 DIAGNOSIS — M1711 Unilateral primary osteoarthritis, right knee: Secondary | ICD-10-CM | POA: Diagnosis not present

## 2018-03-01 MED ORDER — HYDROCODONE-ACETAMINOPHEN 5-325 MG PO TABS: 1.0000 | ORAL_TABLET | Freq: Four times a day (QID) | ORAL | 0 refills | Status: DC | PRN

## 2018-03-01 MED ORDER — ASPIRIN 325 MG PO TBEC: 325.0000 mg | DELAYED_RELEASE_TABLET | Freq: Two times a day (BID) | ORAL | 0 refills | Status: DC

## 2018-03-01 MED ORDER — BISACODYL 5 MG PO TBEC: 5.0000 mg | DELAYED_RELEASE_TABLET | Freq: Every day | ORAL | 0 refills | Status: DC | PRN

## 2018-03-01 MED ORDER — DOCUSATE SODIUM 100 MG PO CAPS: 100.0000 mg | ORAL_CAPSULE | Freq: Two times a day (BID) | ORAL | 0 refills | Status: DC

## 2018-03-01 MED ORDER — TIZANIDINE HCL 4 MG PO TABS: 4.0000 mg | ORAL_TABLET | Freq: Four times a day (QID) | ORAL | 1 refills | Status: AC | PRN

## 2018-03-01 MED FILL — tiZANidine HCL 4 MG TABS: 4 | 10 days supply | Qty: 40 | Fill #0

## 2018-03-01 MED FILL — HYDROCODON-APAP 5-325: 5-325 | 5 days supply | Qty: 40 | Fill #0

## 2018-03-01 MED FILL — DOK 100 MG CAPS: 100 | 15 days supply | Qty: 30 | Fill #0

## 2018-03-01 MED FILL — ASPIRIN EC 325 MG TABLET: 325 | 15 days supply | Qty: 30 | Fill #0

## 2018-03-01 MED FILL — BISACODYL EC 5 MG TBEC: 5 | 15 days supply | Qty: 15 | Fill #0

## 2018-03-01 NOTE — Progress Notes (Addendum)
Subjective: 1 Day Post-Op Procedure(s) (LRB): TOTAL KNEE ARTHROPLASTY (Right)   Patient is doing great walking down the hall. She wishes to go home today.  Activity level:  wbat Diet tolerance:  ok Voiding:  ok Patient reports pain as mild.    Objective: Vital signs in last 24 hours: Temp:  [97.3 F (36.3 C)-98.3 F (36.8 C)] 98 F (36.7 C) (12/11 0645) Pulse Rate:  [50-69] 52 (12/11 0645) Resp:  [11-21] 16 (12/11 0645) BP: (101-147)/(65-106) 110/74 (12/11 0645) SpO2:  [95 %-100 %] 99 % (12/11 0645) Weight:  [71.8 kg] 71.8 kg (12/10 1214)  Labs: No results for input(s): HGB in the last 72 hours. No results for input(s): WBC, RBC, HCT, PLT in the last 72 hours. No results for input(s): NA, K, CL, CO2, BUN, CREATININE, GLUCOSE, CALCIUM in the last 72 hours. No results for input(s): LABPT, INR in the last 72 hours.  Physical Exam:  Neurologically intact ABD soft Neurovascular intact Sensation intact distally Intact pulses distally Dorsiflexion/Plantar flexion intact Incision: dressing C/D/I and no drainage No cellulitis present Compartment soft  Assessment/Plan:  1 Day Post-Op Procedure(s) (LRB): TOTAL KNEE ARTHROPLASTY (Right) Advance diet Up with therapy Discharge home with home health today after PT. Continue on ASA 325mg  BID x 2 weeks post op. Follow up in office 2 weeks post op. Keep bandage clean and dry until follow up.  Patient's anticipated LOS is less than 2 midnights, meeting these requirements: - Younger than 2 - Lives within 1 hour of care - Has a competent adult at home to recover with post-op recover - NO history of  - Chronic pain requiring opiods  - Diabetes  - Coronary Artery Disease  - Heart failure  - Heart attack  - Stroke  - DVT/VTE  - Cardiac arrhythmia  - Respiratory Failure/COPD  - Renal failure  - Anemia  - Advanced Liver disease   Efrem Pitstick PAUL 03/01/2018, 7:43 AM

## 2018-03-01 NOTE — Progress Notes (Signed)
Physical Therapy Treatment & Discharge Patient Details Name: Rebekah Larson MRN: [id number] DOB: 1965/11/05 Today's Date: 03/01/2018    History of Present Illness Pt is a 52 y.o. female s/p R TKA on 02/28/18. PMH includes breast CA (s/p radiation), arthritis.   PT Comments    Pt progressing well with mobility. Mod indep with transfers and ambulation using RW; supervision for ascending/descending steps. Reviewed precautions, positioning, therex for ROM and importance of mobility. Pt plans to start with outpatient PT tomorrow. Has met short-term acute PT goals; pt has no further questions or concerns. Encouraged continued ambulation during hospital admission (RN notified). Will d/c acute PT.   Follow Up Recommendations  Follow surgeon's recommendation for DC plan and follow-up therapies;Supervision for mobility/OOB     Equipment Recommendations  Rolling walker with 5" wheels;3in1 (PT)    Recommendations for Other Services       Precautions / Restrictions Precautions Precautions: Fall;Knee Precaution Comments: Verbally reviewed precautions Restrictions Weight Bearing Restrictions: Yes RLE Weight Bearing: Weight bearing as tolerated    Mobility  Bed Mobility Overal bed mobility: Modified Independent                Transfers Overall transfer level: Modified independent Equipment used: Rolling walker (2 wheeled) Transfers: Sit to/from Stand           General transfer comment: Initially required cues for correct hand placement. Performed all subsequent trials well without cues  Ambulation/Gait Ambulation/Gait assistance: Modified independent (Device/Increase time) Gait Distance (Feet): 200 Feet Assistive device: Rolling walker (2 wheeled) Gait Pattern/deviations: Step-through pattern;Decreased stride length;Decreased weight shift to right;Antalgic Gait velocity: Decreased Gait velocity interpretation: 1.31 - 2.62 ft/sec, indicative of limited community  ambulator General Gait Details: Slow, antalgic amb; mod indep with RW. Pt became "woozy" requiring seated rest break (pt attributed this to pain meds without breakfast).    Stairs Stairs: Yes Stairs assistance: Supervision Stair Management: One rail Right;Step to pattern;Sideways Number of Stairs: 6 General stair comments: Ascend/descended steps with BUE support on R-side rail; supervision for safety. Educ on technique ascending threshold into home backwards with RW   Wheelchair Mobility    Modified Rankin (Stroke Patients Only)       Balance Overall balance assessment: Needs assistance   Sitting balance-Leahy Scale: Good Sitting balance - Comments: Able to don bilateral socks sitting EOB     Standing balance-Leahy Scale: Fair Standing balance comment: Can static stand without UE support                            Cognition Arousal/Alertness: Awake/alert Behavior During Therapy: WFL for tasks assessed/performed Overall Cognitive Status: Within Functional Limits for tasks assessed                                        Exercises Other Exercises Other Exercises: Medbridge Access Code: [email or web address]  Other Exercises: Encouraged to focus on knee ROM (to start with OP PT tomorrow)    General Comments General comments (skin integrity, edema, etc.): PA present - discussed plan for patient to start with OP PT tomorrow. Ace wrap removed      Pertinent Vitals/Pain Pain Assessment: Faces Faces Pain Scale: Hurts little more Pain Location: R knee Pain Descriptors / Indicators: Sore;Guarding;Discomfort Pain Intervention(s): Monitored during session;Limited activity within patient's tolerance    Home Living  Prior Function            PT Goals (current goals can now be found in the care plan section) Acute Rehab PT Goals Patient Stated Goal: Return home and start with outpatient PT (instead of doing HH first) PT Goal  Formulation: With patient Time For Goal Achievement: 03/14/18 Potential to Achieve Goals: Good Progress towards PT goals: Progressing toward goals    Frequency    7X/week      PT Plan Current plan remains appropriate    Co-evaluation              AM-PAC PT "6 Clicks" Mobility   Outcome Measure  Help needed turning from your back to your side while in a flat bed without using bedrails?: None Help needed moving from lying on your back to sitting on the side of a flat bed without using bedrails?: None Help needed moving to and from a bed to a chair (including a wheelchair)?: None Help needed standing up from a chair using your arms (e.g., wheelchair or bedside chair)?: None Help needed to walk in hospital room?: None Help needed climbing 3-5 steps with a railing? : A Little 6 Click Score: 23    End of Session Equipment Utilized During Treatment: Gait belt Activity Tolerance: Patient tolerated treatment well Patient left: in chair;with call bell/phone within reach Nurse Communication: Mobility status PT Visit Diagnosis: Other abnormalities of gait and mobility (R26.89);Pain Pain - Right/Left: Right Pain - part of body: Knee     Time: 0731-0804 PT Time Calculation (min) (ACUTE ONLY): 33 min  Charges:  $Gait Training: 8-22 mins $Therapeutic Activity: 8-22 mins                    Mabeline Caras, PT, DPT Acute Rehabilitation Services  Pager [phone] Office [place] 03/01/2018, 8:19 AM

## 2018-03-01 NOTE — Progress Notes (Signed)
Pt to home accompanied by mom and husband.  Pt has 3N1 and walker to take home. 2 pairs of thigh high TED hose sent home with pt.  Meds filled for home by [facility] and pharmacist reviewed meds with pt.  Pt understands follow up appointment Dr. Latanya Maudlin and outpatient PT which will start tomorrow at their office.  No further questions about home self care. Mother (a retired Marine scientist) will assist pt after DC as well as husband.

## 2018-03-01 NOTE — Anesthesia Postprocedure Evaluation (Signed)
Anesthesia Post Note  Patient: Buel Ream  Procedure(s) Performed: TOTAL KNEE ARTHROPLASTY (Right Knee)     Patient location during evaluation: PACU Anesthesia Type: MAC and Spinal Level of consciousness: oriented and awake and alert Pain management: pain level controlled Vital Signs Assessment: post-procedure vital signs reviewed and stable Respiratory status: spontaneous breathing, respiratory function stable and patient connected to nasal cannula oxygen Cardiovascular status: blood pressure returned to baseline and stable Postop Assessment: no headache, no backache and no apparent nausea or vomiting Anesthetic complications: no    Last Vitals:  Vitals:   03/01/18 0645 03/01/18 1020  BP: 110/74 107/67  Pulse: (!) 52 60  Resp: 16 18  Temp: 36.7 C (!) 36.3 C  SpO2: 99% 100%    Last Pain:  Vitals:   03/01/18 1020  TempSrc: Oral  PainSc:                  Olivia Lopez de Gutierrez S

## 2018-03-01 NOTE — Discharge Summary (Signed)
Patient ID: Rebekah Larson MRN: [id number] DOB/AGE: 08/17/1965 52 y.o.  Admit date: 02/28/2018 Discharge date: 03/01/2018  Admission Diagnoses:  Principal Problem:   Primary localized osteoarthritis of right knee Active Problems:   Primary osteoarthritis of right knee   Discharge Diagnoses:  Same  Past Medical History:  Diagnosis Date  . Arthritis   . Breast cancer, right breast ([facility]) 05/17/14   DUCTAL CARCINOMA IN SITU WITH CALCIFICATIONS  . Heart murmur   . History of kidney stones   . Mitral valve prolapse    very mild, per pt.  . Neoplasm of right breast, primary tumor staging category Tis: ductal carcinoma in situ (DCIS) 05/2014  . Personal history of radiation therapy   . S/P radiation therapy 08/05/2014 through 09/16/2014   Right breast 4800 cGy in 24 sessions, right breast tumor bed boost 1200 cGy in 6 sessions, for a cumulative tumor bed dose of 6000 cGy in 30 sessions    . Shingles     Surgeries: Procedure(s): TOTAL KNEE ARTHROPLASTY on 02/28/2018   Consultants:   Discharged Condition: Improved  Hospital Course: Rebekah Larson is an 52 y.o. female who was admitted 02/28/2018 for operative treatment ofPrimary localized osteoarthritis of right knee. Patient has severe unremitting pain that affects sleep, daily activities, and work/hobbies. After pre-op clearance the patient was taken to the operating room on 02/28/2018 and underwent  Procedure(s): TOTAL KNEE ARTHROPLASTY.    Patient was given perioperative antibiotics:  Anti-infectives (From admission, onward)   Start     Dose/Rate Route Frequency Ordered Stop   02/28/18 1330  ceFAZolin (ANCEF) IVPB 2g/100 mL premix     2 g 200 mL/hr over 30 Minutes Intravenous Every 6 hours 02/28/18 1223 02/28/18 2031   02/28/18 0600  ceFAZolin (ANCEF) IVPB 2g/100 mL premix     2 g 200 mL/hr over 30 Minutes Intravenous On call to O.R. 02/28/18 0543 02/28/18 0748       Patient was given sequential  compression devices, early ambulation, and chemoprophylaxis to prevent DVT.  Patient benefited maximally from hospital stay and there were no complications.    Recent vital signs:  Patient Vitals for the past 24 hrs:  BP Temp Temp src Pulse Resp SpO2 Height Weight  03/01/18 0645 110/74 98 F (36.7 C) Oral (!) 52 16 99 % - -  03/01/18 0303 104/65 98.3 F (36.8 C) Oral (!) 55 16 99 % - -  02/28/18 2130 114/77 98.2 F (36.8 C) Oral (!) 55 18 98 % - -  02/28/18 1756 127/84 98 F (36.7 C) Oral 62 18 98 % - -  02/28/18 1428 115/80 97.8 F (36.6 C) Oral 62 18 95 % - -  02/28/18 1214 126/82 98.3 F (36.8 C) Oral 61 18 98 % 5\' 8"  (1.727 m) 71.8 kg  02/28/18 1200 - (!) 97.3 F (36.3 C) - - - - - -  02/28/18 1158 129/76 - - 69 18 97 % - -  02/28/18 1155 - - - 65 12 97 % - -  02/28/18 1145 - - - 62 12 99 % - -  02/28/18 1140 131/85 - - 63 15 99 % - -  02/28/18 1130 (!) 147/106 - - (!) 54 12 99 % - -  02/28/18 1125 - - - (!) 56 (!) 21 100 % - -  02/28/18 1115 - - - (!) 56 11 100 % - -  02/28/18 1110 135/90 - - (!) 53 12 100 % - -  02/28/18  1100 - - - (!) 56 11 100 % - -  02/28/18 1015 101/90 - - (!) 50 13 100 % - -  02/28/18 1000 139/77 - - (!) 50 17 100 % - -  02/28/18 0943 122/74 98 F (36.7 C) - - 16 - - -     Recent laboratory studies: No results for input(s): WBC, HGB, HCT, PLT, NA, K, CL, CO2, BUN, CREATININE, GLUCOSE, INR, CALCIUM in the last 72 hours.  Invalid input(s): PT, 2   Discharge Medications:   Allergies as of 03/01/2018   No Known Allergies     Medication List    STOP taking these medications   ibuprofen 200 MG tablet Commonly known as:  ADVIL,MOTRIN   meloxicam 15 MG tablet Commonly known as:  MOBIC     TAKE these medications   aspirin 325 MG EC tablet Take 1 tablet (325 mg total) by mouth 2 (two) times daily after a meal.   bisacodyl 5 MG EC tablet Commonly known as:  DULCOLAX Take 1 tablet (5 mg total) by mouth daily as needed for moderate  constipation.   cholecalciferol 1000 units tablet Commonly known as:  VITAMIN D Take 1,000 Units by mouth daily. Reported on 04/22/2015   docusate sodium 100 MG capsule Commonly known as:  COLACE Take 1 capsule (100 mg total) by mouth 2 (two) times daily.   famotidine 10 MG tablet Commonly known as:  PEPCID Take 10 mg by mouth as needed for heartburn or indigestion.   HYDROcodone-acetaminophen 5-325 MG tablet Commonly known as:  NORCO/VICODIN Take 1-2 tablets by mouth every 6 (six) hours as needed for moderate pain (pain score 4-6).   magnesium 30 MG tablet Take 30 mg by mouth 2 (two) times daily.   ranitidine 150 MG capsule Commonly known as:  ZANTAC Take 150 mg by mouth as needed for heartburn.   tiZANidine 4 MG tablet Commonly known as:  ZANAFLEX Take 1 tablet (4 mg total) by mouth every 6 (six) hours as needed.   TUMS SMOOTHIES 750 MG chewable tablet Generic drug:  calcium carbonate Chew 1 tablet by mouth as needed for heartburn.            Durable Medical Equipment  (From admission, onward)         Start     Ordered   02/28/18 1224  DME Walker rolling  Once    Question:  Patient needs a walker to treat with the following condition  Answer:  Primary osteoarthritis of right knee   02/28/18 1223   02/28/18 1224  DME 3 n 1  Once     02/28/18 1223   02/28/18 1224  DME Bedside commode  Once    Question:  Patient needs a bedside commode to treat with the following condition  Answer:  Primary osteoarthritis of right knee   02/28/18 1223          Diagnostic Studies: Dg Chest 2 View  Result Date: 02/15/2018 CLINICAL DATA:  Preop evaluation for total knee replacement. History of breast carcinoma EXAM: CHEST - 2 VIEW COMPARISON:  None. FINDINGS: Lungs are clear. The heart size and pulmonary vascularity are normal. No adenopathy. No bone lesions. Postoperative changes noted in the right breast region. IMPRESSION: No edema or consolidation. Electronically Signed    By: Lowella Grip III M.D.   On: 02/15/2018 14:50    Disposition: Discharge disposition: 01-Home or Self Care       Discharge Instructions    Call  MD / Call 911   Complete by:  As directed    If you experience chest pain or shortness of breath, CALL 911 and be transported to the hospital emergency room.  If you develope a fever above 101 F, pus (white drainage) or increased drainage or redness at the wound, or calf pain, call your surgeon's office.   Constipation Prevention   Complete by:  As directed    Drink plenty of fluids.  Prune juice may be helpful.  You may use a stool softener, such as Colace (over the counter) 100 mg twice a day.  Use MiraLax (over the counter) for constipation as needed.   Diet - low sodium heart healthy   Complete by:  As directed    Discharge instructions   Complete by:  As directed    INSTRUCTIONS AFTER JOINT REPLACEMENT   Remove items at home which could result in a fall. This includes throw rugs or furniture in walking pathways ICE to the affected joint every three hours while awake for 30 minutes at a time, for at least the first 3-5 days, and then as needed for pain and swelling.  Continue to use ice for pain and swelling. You may notice swelling that will progress down to the foot and ankle.  This is normal after surgery.  Elevate your leg when you are not up walking on it.   Continue to use the breathing machine you got in the hospital (incentive spirometer) which will help keep your temperature down.  It is common for your temperature to cycle up and down following surgery, especially at night when you are not up moving around and exerting yourself.  The breathing machine keeps your lungs expanded and your temperature down.   DIET:  As you were doing prior to hospitalization, we recommend a well-balanced diet.  DRESSING / WOUND CARE / SHOWERING  You may shower 3 days after surgery, but keep the wounds dry during showering.  You may use an  occlusive plastic wrap (Press'n Seal for example), NO SOAKING/SUBMERGING IN THE BATHTUB.  If the bandage gets wet, change with a clean dry gauze.  If the incision gets wet, pat the wound dry with a clean towel.  ACTIVITY  Increase activity slowly as tolerated, but follow the weight bearing instructions below.   No driving for 6 weeks or until further direction given by your physician.  You cannot drive while taking narcotics.  No lifting or carrying greater than 10 lbs. until further directed by your surgeon. Avoid periods of inactivity such as sitting longer than an hour when not asleep. This helps prevent blood clots.  You may return to work once you are authorized by your doctor.     WEIGHT BEARING   Weight bearing as tolerated with assist device (walker, cane, etc) as directed, use it as long as suggested by your surgeon or therapist, typically at least 4-6 weeks.   EXERCISES  Results after joint replacement surgery are often greatly improved when you follow the exercise, range of motion and muscle strengthening exercises prescribed by your doctor. Safety measures are also important to protect the joint from further injury. Any time any of these exercises cause you to have increased pain or swelling, decrease what you are doing until you are comfortable again and then slowly increase them. If you have problems or questions, call your caregiver or physical therapist for advice.   Rehabilitation is important following a joint replacement. After just a few days of  immobilization, the muscles of the leg can become weakened and shrink (atrophy).  These exercises are designed to build up the tone and strength of the thigh and leg muscles and to improve motion. Often times heat used for twenty to thirty minutes before working out will loosen up your tissues and help with improving the range of motion but do not use heat for the first two weeks following surgery (sometimes heat can increase  post-operative swelling).   These exercises can be done on a training (exercise) mat, on the floor, on a table or on a bed. Use whatever works the best and is most comfortable for you.    Use music or television while you are exercising so that the exercises are a pleasant break in your day. This will make your life better with the exercises acting as a break in your routine that you can look forward to.   Perform all exercises about fifteen times, three times per day or as directed.  You should exercise both the operative leg and the other leg as well.   Exercises include:   Quad Sets - Tighten up the muscle on the front of the thigh (Quad) and hold for 5-10 seconds.   Straight Leg Raises - With your knee straight (if you were given a brace, keep it on), lift the leg to 60 degrees, hold for 3 seconds, and slowly lower the leg.  Perform this exercise against resistance later as your leg gets stronger.  Leg Slides: Lying on your back, slowly slide your foot toward your buttocks, bending your knee up off the floor (only go as far as is comfortable). Then slowly slide your foot back down until your leg is flat on the floor again.  Angel Wings: Lying on your back spread your legs to the side as far apart as you can without causing discomfort.  Hamstring Strength:  Lying on your back, push your heel against the floor with your leg straight by tightening up the muscles of your buttocks.  Repeat, but this time bend your knee to a comfortable angle, and push your heel against the floor.  You may put a pillow under the heel to make it more comfortable if necessary.   A rehabilitation program following joint replacement surgery can speed recovery and prevent re-injury in the future due to weakened muscles. Contact your doctor or a physical therapist for more information on knee rehabilitation.    CONSTIPATION  Constipation is defined medically as fewer than three stools per week and severe constipation as  less than one stool per week.  Even if you have a regular bowel pattern at home, your normal regimen is likely to be disrupted due to multiple reasons following surgery.  Combination of anesthesia, postoperative narcotics, change in appetite and fluid intake all can affect your bowels.   YOU MUST use at least one of the following options; they are listed in order of increasing strength to get the job done.  They are all available over the counter, and you may need to use some, POSSIBLY even all of these options:    Drink plenty of fluids (prune juice may be helpful) and high fiber foods Colace 100 mg by mouth twice a day  Senokot for constipation as directed and as needed Dulcolax (bisacodyl), take with full glass of water  Miralax (polyethylene glycol) once or twice a day as needed.  If you have tried all these things and are unable to have a bowel movement  in the first 3-4 days after surgery call either your surgeon or your primary doctor.    If you experience loose stools or diarrhea, hold the medications until you stool forms back up.  If your symptoms do not get better within 1 week or if they get worse, check with your doctor.  If you experience "the worst abdominal pain ever" or develop nausea or vomiting, please contact the office immediately for further recommendations for treatment.   ITCHING:  If you experience itching with your medications, try taking only a single pain pill, or even half a pain pill at a time.  You can also use Benadryl over the counter for itching or also to help with sleep.   TED HOSE STOCKINGS:  Use stockings on both legs until for at least 2 weeks or as directed by physician office. They may be removed at night for sleeping.  MEDICATIONS:  See your medication summary on the "After Visit Summary" that nursing will review with you.  You may have some home medications which will be placed on hold until you complete the course of blood thinner medication.  It is  important for you to complete the blood thinner medication as prescribed.  PRECAUTIONS:  If you experience chest pain or shortness of breath - call 911 immediately for transfer to the hospital emergency department.   If you develop a fever greater that 101 F, purulent drainage from wound, increased redness or drainage from wound, foul odor from the wound/dressing, or calf pain - CONTACT YOUR SURGEON.                                                   FOLLOW-UP APPOINTMENTS:  If you do not already have a post-op appointment, please call the office for an appointment to be seen by your surgeon.  Guidelines for how soon to be seen are listed in your "After Visit Summary", but are typically between 1-4 weeks after surgery.  OTHER INSTRUCTIONS:   Knee Replacement:  Do not place pillow under knee, focus on keeping the knee straight while resting. CPM instructions: 0-90 degrees, 2 hours in the morning, 2 hours in the afternoon, and 2 hours in the evening. Place foam block, curve side up under heel at all times except when in CPM or when walking.  DO NOT modify, tear, cut, or change the foam block in any way.  MAKE SURE YOU:  Understand these instructions.  Get help right away if you are not doing well or get worse.    Thank you for letting us be a part of your medical care team.  It is a privilege we respect greatly.  We hope these instructions will help you stay on track for a fast and full recovery!   Increase activity slowly as tolerated   Complete by:  As directed       Follow-up Information    Melrose Nakayama, MD. Schedule an appointment as soon as possible for a visit in 2 weeks.   Specialty:  Orthopedic Surgery Contact information: [place] [id number] [phone]            Signed: Rich Fuchs 03/01/2018, 7:47 AM

## 2018-03-01 NOTE — Care Management Note (Signed)
Case Management Note  Patient Details  Name: Rebekah Larson MRN: [id number] Date of Birth: 1965/07/21  Subjective/Objective:                    Action/Plan:  Patient wanting Outpatient PT instead of home health PT. Per patient, Loni Dolly PA has arranged OP PT. Has arranged OP PT and she has an appointment for tomorrow.   Dan with [facility] updated.   Called for patient's 3 in1 and walker yesterday. Patient has not received equipment. Called Jeneen Rinks with [facility] again for DME, Jeneen Rinks is aware patient waiting on DME to be discharged. Expected Discharge Date:  03/01/18               Expected Discharge Plan:  Home/Self Care  In-House Referral:     Discharge planning Services  CM Consult  Post Acute Care Choice:  Durable Medical Equipment, Home Health Choice offered to:  Patient  DME Arranged:  3-N-1, Walker rolling DME Agency:  [place]:  NA [place] Agency:  NA  Status of Service:  Completed, signed off  If discussed at [place] of Stay Meetings, dates discussed:    Additional Comments:  Marilu Favre, RN 03/01/2018, 10:13 AM

## 2018-03-01 NOTE — Telephone Encounter (Signed)
Pt is on TCM List after admission for right knee arthroplasty.  Pt dc'ed on 03/01/2018. Pt to follow up with ortho.

## 2018-03-13 MED FILL — HYDROCODON-APAP 5-325: 5-325 | 5 days supply | Qty: 40 | Fill #0

## 2018-03-22 DIAGNOSIS — M1711 Unilateral primary osteoarthritis, right knee: Secondary | ICD-10-CM | POA: Diagnosis not present

## 2018-03-22 DIAGNOSIS — Z96651 Presence of right artificial knee joint: Secondary | ICD-10-CM | POA: Diagnosis not present

## 2018-03-23 DIAGNOSIS — Z96651 Presence of right artificial knee joint: Secondary | ICD-10-CM | POA: Diagnosis not present

## 2018-03-23 DIAGNOSIS — M25661 Stiffness of right knee, not elsewhere classified: Secondary | ICD-10-CM | POA: Diagnosis not present

## 2018-03-27 DIAGNOSIS — M25661 Stiffness of right knee, not elsewhere classified: Secondary | ICD-10-CM | POA: Diagnosis not present

## 2018-03-27 MED FILL — IBUPROFEN 800 MG TAB: 800 | 30 days supply | Qty: 90 | Fill #0

## 2018-03-28 DIAGNOSIS — Z96651 Presence of right artificial knee joint: Secondary | ICD-10-CM | POA: Diagnosis not present

## 2018-03-28 DIAGNOSIS — M25661 Stiffness of right knee, not elsewhere classified: Secondary | ICD-10-CM | POA: Diagnosis not present

## 2018-03-30 DIAGNOSIS — Z96651 Presence of right artificial knee joint: Secondary | ICD-10-CM | POA: Diagnosis not present

## 2018-03-30 DIAGNOSIS — M25661 Stiffness of right knee, not elsewhere classified: Secondary | ICD-10-CM | POA: Diagnosis not present

## 2018-03-31 DIAGNOSIS — Z96651 Presence of right artificial knee joint: Secondary | ICD-10-CM | POA: Diagnosis not present

## 2018-03-31 DIAGNOSIS — M25661 Stiffness of right knee, not elsewhere classified: Secondary | ICD-10-CM | POA: Diagnosis not present

## 2018-04-03 DIAGNOSIS — Z96651 Presence of right artificial knee joint: Secondary | ICD-10-CM | POA: Diagnosis not present

## 2018-04-03 DIAGNOSIS — M25661 Stiffness of right knee, not elsewhere classified: Secondary | ICD-10-CM | POA: Diagnosis not present

## 2018-04-05 DIAGNOSIS — Z96651 Presence of right artificial knee joint: Secondary | ICD-10-CM | POA: Diagnosis not present

## 2018-04-05 DIAGNOSIS — M25661 Stiffness of right knee, not elsewhere classified: Secondary | ICD-10-CM | POA: Diagnosis not present

## 2018-04-06 DIAGNOSIS — M25561 Pain in right knee: Secondary | ICD-10-CM | POA: Diagnosis not present

## 2018-04-07 DIAGNOSIS — Z96651 Presence of right artificial knee joint: Secondary | ICD-10-CM | POA: Diagnosis not present

## 2018-04-07 DIAGNOSIS — M25661 Stiffness of right knee, not elsewhere classified: Secondary | ICD-10-CM | POA: Diagnosis not present

## 2018-04-10 DIAGNOSIS — M25661 Stiffness of right knee, not elsewhere classified: Secondary | ICD-10-CM | POA: Diagnosis not present

## 2018-04-10 DIAGNOSIS — Z96651 Presence of right artificial knee joint: Secondary | ICD-10-CM | POA: Diagnosis not present

## 2018-04-12 DIAGNOSIS — M25661 Stiffness of right knee, not elsewhere classified: Secondary | ICD-10-CM | POA: Diagnosis not present

## 2018-04-12 DIAGNOSIS — Z96651 Presence of right artificial knee joint: Secondary | ICD-10-CM | POA: Diagnosis not present

## 2018-04-12 MED FILL — tiZANidine HCL 4 MG TABS: 4 | 10 days supply | Qty: 40 | Fill #0

## 2018-04-13 DIAGNOSIS — M25661 Stiffness of right knee, not elsewhere classified: Secondary | ICD-10-CM | POA: Diagnosis not present

## 2018-04-13 DIAGNOSIS — Z96651 Presence of right artificial knee joint: Secondary | ICD-10-CM | POA: Diagnosis not present

## 2018-04-17 DIAGNOSIS — Z96651 Presence of right artificial knee joint: Secondary | ICD-10-CM | POA: Diagnosis not present

## 2018-04-17 DIAGNOSIS — M25661 Stiffness of right knee, not elsewhere classified: Secondary | ICD-10-CM | POA: Diagnosis not present

## 2018-04-19 DIAGNOSIS — M25661 Stiffness of right knee, not elsewhere classified: Secondary | ICD-10-CM | POA: Diagnosis not present

## 2018-04-19 DIAGNOSIS — Z96651 Presence of right artificial knee joint: Secondary | ICD-10-CM | POA: Diagnosis not present

## 2018-04-20 DIAGNOSIS — Z96651 Presence of right artificial knee joint: Secondary | ICD-10-CM | POA: Diagnosis not present

## 2018-04-20 DIAGNOSIS — M25661 Stiffness of right knee, not elsewhere classified: Secondary | ICD-10-CM | POA: Diagnosis not present

## 2018-04-24 DIAGNOSIS — M25661 Stiffness of right knee, not elsewhere classified: Secondary | ICD-10-CM | POA: Diagnosis not present

## 2018-04-24 DIAGNOSIS — Z96651 Presence of right artificial knee joint: Secondary | ICD-10-CM | POA: Diagnosis not present

## 2018-04-24 DIAGNOSIS — Z471 Aftercare following joint replacement surgery: Secondary | ICD-10-CM | POA: Diagnosis not present

## 2018-04-26 DIAGNOSIS — M25661 Stiffness of right knee, not elsewhere classified: Secondary | ICD-10-CM | POA: Diagnosis not present

## 2018-04-26 DIAGNOSIS — Z96651 Presence of right artificial knee joint: Secondary | ICD-10-CM | POA: Diagnosis not present

## 2018-04-27 ENCOUNTER — Other Ambulatory Visit: Payer: Self-pay | Admitting: Hematology and Oncology

## 2018-04-27 DIAGNOSIS — Z853 Personal history of malignant neoplasm of breast: Secondary | ICD-10-CM

## 2018-04-27 DIAGNOSIS — Z96651 Presence of right artificial knee joint: Secondary | ICD-10-CM | POA: Diagnosis not present

## 2018-04-27 DIAGNOSIS — M25661 Stiffness of right knee, not elsewhere classified: Secondary | ICD-10-CM | POA: Diagnosis not present

## 2018-05-03 DIAGNOSIS — Z96651 Presence of right artificial knee joint: Secondary | ICD-10-CM | POA: Diagnosis not present

## 2018-05-03 DIAGNOSIS — M25661 Stiffness of right knee, not elsewhere classified: Secondary | ICD-10-CM | POA: Diagnosis not present

## 2018-05-04 DIAGNOSIS — M25661 Stiffness of right knee, not elsewhere classified: Secondary | ICD-10-CM | POA: Diagnosis not present

## 2018-05-04 DIAGNOSIS — Z96651 Presence of right artificial knee joint: Secondary | ICD-10-CM | POA: Diagnosis not present

## 2018-05-07 DIAGNOSIS — M25561 Pain in right knee: Secondary | ICD-10-CM | POA: Diagnosis not present

## 2018-05-08 ENCOUNTER — Other Ambulatory Visit: Payer: Self-pay

## 2018-05-08 ENCOUNTER — Other Ambulatory Visit: Payer: Self-pay | Admitting: Hematology and Oncology

## 2018-05-08 DIAGNOSIS — Z853 Personal history of malignant neoplasm of breast: Secondary | ICD-10-CM

## 2018-05-08 DIAGNOSIS — M25661 Stiffness of right knee, not elsewhere classified: Secondary | ICD-10-CM | POA: Diagnosis not present

## 2018-05-08 DIAGNOSIS — Z96651 Presence of right artificial knee joint: Secondary | ICD-10-CM | POA: Diagnosis not present

## 2018-05-10 ENCOUNTER — Ambulatory Visit
Admission: RE | Admit: 2018-05-10 | Discharge: 2018-05-10 | Disposition: A | Discharge status: Home / Self Care | Payer: 59 | Source: Ambulatory Visit | Attending: Hematology and Oncology | Admitting: Hematology and Oncology

## 2018-05-10 DIAGNOSIS — Z853 Personal history of malignant neoplasm of breast: Secondary | ICD-10-CM | POA: Diagnosis not present

## 2018-05-10 DIAGNOSIS — Z96651 Presence of right artificial knee joint: Secondary | ICD-10-CM | POA: Diagnosis not present

## 2018-05-10 DIAGNOSIS — M25661 Stiffness of right knee, not elsewhere classified: Secondary | ICD-10-CM | POA: Diagnosis not present

## 2018-05-10 DIAGNOSIS — R922 Inconclusive mammogram: Secondary | ICD-10-CM | POA: Diagnosis not present

## 2018-05-11 DIAGNOSIS — Z96651 Presence of right artificial knee joint: Secondary | ICD-10-CM | POA: Diagnosis not present

## 2018-05-11 DIAGNOSIS — M25661 Stiffness of right knee, not elsewhere classified: Secondary | ICD-10-CM | POA: Diagnosis not present

## 2018-05-15 DIAGNOSIS — M25661 Stiffness of right knee, not elsewhere classified: Secondary | ICD-10-CM | POA: Diagnosis not present

## 2018-05-15 DIAGNOSIS — Z96651 Presence of right artificial knee joint: Secondary | ICD-10-CM | POA: Diagnosis not present

## 2018-05-17 DIAGNOSIS — Z96651 Presence of right artificial knee joint: Secondary | ICD-10-CM | POA: Diagnosis not present

## 2018-05-17 DIAGNOSIS — M25661 Stiffness of right knee, not elsewhere classified: Secondary | ICD-10-CM | POA: Diagnosis not present

## 2018-05-18 DIAGNOSIS — Z96651 Presence of right artificial knee joint: Secondary | ICD-10-CM | POA: Diagnosis not present

## 2018-05-18 DIAGNOSIS — M25661 Stiffness of right knee, not elsewhere classified: Secondary | ICD-10-CM | POA: Diagnosis not present

## 2018-05-22 DIAGNOSIS — M25661 Stiffness of right knee, not elsewhere classified: Secondary | ICD-10-CM | POA: Diagnosis not present

## 2018-05-22 DIAGNOSIS — Z96651 Presence of right artificial knee joint: Secondary | ICD-10-CM | POA: Diagnosis not present

## 2018-05-24 DIAGNOSIS — M25661 Stiffness of right knee, not elsewhere classified: Secondary | ICD-10-CM | POA: Diagnosis not present

## 2018-05-25 DIAGNOSIS — M25661 Stiffness of right knee, not elsewhere classified: Secondary | ICD-10-CM | POA: Diagnosis not present

## 2018-05-25 DIAGNOSIS — Z96651 Presence of right artificial knee joint: Secondary | ICD-10-CM | POA: Diagnosis not present

## 2018-06-01 DIAGNOSIS — M25661 Stiffness of right knee, not elsewhere classified: Secondary | ICD-10-CM | POA: Diagnosis not present

## 2018-06-01 DIAGNOSIS — Z96651 Presence of right artificial knee joint: Secondary | ICD-10-CM | POA: Diagnosis not present

## 2018-06-05 DIAGNOSIS — M25561 Pain in right knee: Secondary | ICD-10-CM | POA: Diagnosis not present

## 2018-07-06 DIAGNOSIS — M25561 Pain in right knee: Secondary | ICD-10-CM | POA: Diagnosis not present

## 2018-08-05 DIAGNOSIS — M25561 Pain in right knee: Secondary | ICD-10-CM | POA: Diagnosis not present

## 2018-08-23 DIAGNOSIS — M1711 Unilateral primary osteoarthritis, right knee: Secondary | ICD-10-CM | POA: Diagnosis not present

## 2018-08-23 MED FILL — AMOXICILLIN 500 MG CAPSULE: 500 | 3 days supply | Qty: 12 | Fill #0

## 2018-09-05 DIAGNOSIS — M25561 Pain in right knee: Secondary | ICD-10-CM | POA: Diagnosis not present

## 2018-10-03 ENCOUNTER — Other Ambulatory Visit: Payer: Self-pay | Admitting: Internal Medicine

## 2018-10-03 DIAGNOSIS — Z20828 Contact with and (suspected) exposure to other viral communicable diseases: Secondary | ICD-10-CM

## 2018-10-03 DIAGNOSIS — Z20822 Contact with and (suspected) exposure to covid-19: Secondary | ICD-10-CM

## 2018-10-05 DIAGNOSIS — M25561 Pain in right knee: Secondary | ICD-10-CM | POA: Diagnosis not present

## 2018-11-01 DIAGNOSIS — M8588 Other specified disorders of bone density and structure, other site: Secondary | ICD-10-CM | POA: Diagnosis not present

## 2018-11-01 DIAGNOSIS — Z13820 Encounter for screening for osteoporosis: Secondary | ICD-10-CM | POA: Diagnosis not present

## 2018-11-01 DIAGNOSIS — Z01419 Encounter for gynecological examination (general) (routine) without abnormal findings: Secondary | ICD-10-CM | POA: Diagnosis not present

## 2018-11-01 DIAGNOSIS — Z6823 Body mass index (BMI) 23.0-23.9, adult: Secondary | ICD-10-CM | POA: Diagnosis not present

## 2018-11-02 MED FILL — VIT D2 1.25 MG (50,000 UNIT: 1.25 MG | 70 days supply | Qty: 10 | Fill #0

## 2018-12-01 MED FILL — PROLIA 60 MG/ML SOLN: 60 | 180 days supply | Qty: 1 | Fill #0

## 2018-12-04 DIAGNOSIS — M810 Age-related osteoporosis without current pathological fracture: Secondary | ICD-10-CM | POA: Diagnosis not present

## 2018-12-05 ENCOUNTER — Ambulatory Visit: Payer: 59

## 2018-12-06 ENCOUNTER — Other Ambulatory Visit: Payer: Self-pay

## 2019-05-17 ENCOUNTER — Ambulatory Visit: Payer: 59 | Attending: Internal Medicine

## 2019-05-17 DIAGNOSIS — Z23 Encounter for immunization: Secondary | ICD-10-CM | POA: Insufficient documentation

## 2019-05-17 NOTE — Progress Notes (Signed)
   Covid-19 Vaccination Clinic  Name:  Rebekah Larson    MRN: [id number] DOB: 1965-05-24  05/17/2019  [name] was observed post Covid-19 immunization for 15 minutes without incidence. She was provided with Vaccine Information Sheet and instruction to access the V-Safe system.   [name] was instructed to call 911 with any severe reactions post vaccine: Marland Kitchen Difficulty breathing  . Swelling of your face and throat  . A fast heartbeat  . A bad rash all over your body  . Dizziness and weakness    Immunizations Administered    Name Date Dose VIS Date Route   Pfizer COVID-19 Vaccine 05/17/2019  5:00 PM 0.3 mL 03/02/2019 Intramuscular   Manufacturer: [place]   Lot: [id number]   [place]: [id number]

## 2019-06-05 DIAGNOSIS — M810 Age-related osteoporosis without current pathological fracture: Secondary | ICD-10-CM | POA: Diagnosis not present

## 2019-06-11 ENCOUNTER — Other Ambulatory Visit: Payer: Self-pay | Admitting: Hematology and Oncology

## 2019-06-11 DIAGNOSIS — Z9889 Other specified postprocedural states: Secondary | ICD-10-CM

## 2019-06-13 ENCOUNTER — Ambulatory Visit: Payer: 59 | Attending: Internal Medicine

## 2019-06-13 DIAGNOSIS — Z23 Encounter for immunization: Secondary | ICD-10-CM

## 2019-06-13 NOTE — Progress Notes (Signed)
   Covid-19 Vaccination Clinic  Name:  Rebekah Larson    MRN: [id number] DOB: May 28, 1965  06/13/2019  [name] was observed post Covid-19 immunization for 15 minutes without incident. She was provided with Vaccine Information Sheet and instruction to access the V-Safe system.   [name] was instructed to call 911 with any severe reactions post vaccine: Marland Kitchen Difficulty breathing  . Swelling of face and throat  . A fast heartbeat  . A bad rash all over body  . Dizziness and weakness   Immunizations Administered    Name Date Dose VIS Date Route   Pfizer COVID-19 Vaccine 06/13/2019  4:59 PM 0.3 mL 03/02/2019 Intramuscular   Manufacturer: [place]   Lot: [id number]   [place]: [id number]

## 2019-07-02 MED FILL — PROLIA 60 MG/ML SOLN: 60 | 180 days supply | Qty: 1 | Fill #0

## 2019-07-04 DIAGNOSIS — M810 Age-related osteoporosis without current pathological fracture: Secondary | ICD-10-CM | POA: Diagnosis not present

## 2019-07-25 ENCOUNTER — Other Ambulatory Visit: Payer: Self-pay

## 2019-07-25 ENCOUNTER — Ambulatory Visit
Admission: RE | Admit: 2019-07-25 | Discharge: 2019-07-25 | Disposition: A | Discharge status: Home / Self Care | Payer: 59 | Source: Ambulatory Visit | Attending: Hematology and Oncology | Admitting: Hematology and Oncology

## 2019-07-25 DIAGNOSIS — R922 Inconclusive mammogram: Secondary | ICD-10-CM | POA: Diagnosis not present

## 2019-07-25 DIAGNOSIS — Z853 Personal history of malignant neoplasm of breast: Secondary | ICD-10-CM | POA: Diagnosis not present

## 2019-07-25 DIAGNOSIS — Z9889 Other specified postprocedural states: Secondary | ICD-10-CM

## 2019-08-22 ENCOUNTER — Other Ambulatory Visit: Payer: Self-pay

## 2019-08-22 ENCOUNTER — Ambulatory Visit: Payer: 59 | Admitting: Internal Medicine

## 2019-08-22 ENCOUNTER — Encounter: Payer: Self-pay | Admitting: Internal Medicine

## 2019-08-22 VITALS — BP 128/82 | HR 56 | Temp 98.2°F | Resp 16 | Ht 68.0 in | Wt 145.0 lb

## 2019-08-22 DIAGNOSIS — E559 Vitamin D deficiency, unspecified: Secondary | ICD-10-CM

## 2019-08-22 DIAGNOSIS — R21 Rash and other nonspecific skin eruption: Secondary | ICD-10-CM

## 2019-08-22 DIAGNOSIS — K219 Gastro-esophageal reflux disease without esophagitis: Secondary | ICD-10-CM

## 2019-08-22 LAB — CBC WITH DIFFERENTIAL/PLATELET
Basophils Absolute: 0.1 10*3/uL (ref 0.0–0.1)
Basophils Relative: 0.6 % (ref 0.0–3.0)
Eosinophils Absolute: 0.1 10*3/uL (ref 0.0–0.7)
Eosinophils Relative: 0.6 % (ref 0.0–5.0)
HCT: 39.1 % (ref 36.0–46.0)
Hemoglobin: 13.5 g/dL (ref 12.0–15.0)
Lymphocytes Relative: 18.5 % (ref 12.0–46.0)
Lymphs Abs: 1.5 10*3/uL (ref 0.7–4.0)
MCHC: 34.6 g/dL (ref 30.0–36.0)
MCV: 93.1 fl (ref 78.0–100.0)
Monocytes Absolute: 0.5 10*3/uL (ref 0.1–1.0)
Monocytes Relative: 6.7 % (ref 3.0–12.0)
Neutro Abs: 5.8 10*3/uL (ref 1.4–7.7)
Neutrophils Relative %: 73.6 % (ref 43.0–77.0)
Platelets: 188 10*3/uL (ref 150.0–400.0)
RBC: 4.2 Mil/uL (ref 3.87–5.11)
RDW: 13.2 % (ref 11.5–15.5)
WBC: 7.9 10*3/uL (ref 4.0–10.5)

## 2019-08-22 LAB — COMPREHENSIVE METABOLIC PANEL
ALT: 12 U/L (ref 0–35)
AST: 16 U/L (ref 0–37)
Albumin: 4.6 g/dL (ref 3.5–5.2)
Alkaline Phosphatase: 42 U/L (ref 39–117)
BUN: 18 mg/dL (ref 6–23)
CO2: 27 mEq/L (ref 19–32)
Calcium: 9 mg/dL (ref 8.4–10.5)
Chloride: 106 mEq/L (ref 96–112)
Creatinine, Ser: 0.67 mg/dL (ref 0.40–1.20)
GFR: 90.95 mL/min (ref >60.00)
Glucose, Bld: 88 mg/dL (ref 70–99)
Potassium: 3.7 mEq/L (ref 3.5–5.1)
Sodium: 139 mEq/L (ref 135–145)
Total Bilirubin: 0.6 mg/dL (ref 0.2–1.2)
Total Protein: 6.7 g/dL (ref 6.0–8.3)

## 2019-08-22 LAB — VITAMIN D 25 HYDROXY (VIT D DEFICIENCY, FRACTURES): VITD: 26.83 ng/mL — ABNORMAL LOW (ref 30.00–100.00)

## 2019-08-22 NOTE — Progress Notes (Signed)
Subjective:    Patient ID: Rebekah Larson, female    DOB: 08/08/1965, 54 y.o.   MRN: [id number]  HPI The patient is here for an acute visit.  Rash - located in her right upper thigh.  It is circular with a central clearing.  She denies an itching or pain.  She has not applied anything and it has gotten better.  She noticed it at least three weeks ago.  The size has not changed since she first saw it, but it is lighter in color.  She denies any other skin rashes/concerns.    She does trail walking.  She does not think she was bitten by a tick.  She takes all precautions.    She is concerned about her vitamin d level - it has been chronically low.      Medications and allergies reviewed with patient and updated if appropriate.  Patient Active Problem List   Diagnosis Date Noted  . Vitamin D deficiency 08/22/2019  . Primary localized osteoarthritis of right knee 02/28/2018  . Primary osteoarthritis of right knee 02/28/2018  . Low back pain 09/06/2017  . Nonallopathic lesion of lumbosacral region 09/06/2017  . Nonallopathic lesion of sacral region 09/06/2017  . Nonallopathic lesion of thoracic region 09/06/2017  . Degenerative joint disease of knee, right 01/26/2017  . Acute medial meniscal tear, right, initial encounter 01/13/2017  . Chalazion of right lower eyelid 08/23/2016  . Meniscal cyst 06/17/2015  . MVP (mitral valve prolapse) 04/22/2015  . Genetic testing 06/10/2014  . Neoplasm of right breast, primary tumor staging category Tis: ductal carcinoma in situ (DCIS) 05/27/2014  . Osteopenia 10/09/2007    Current Outpatient Medications on File Prior to Visit  Medication Sig Dispense Refill  . cholecalciferol (VITAMIN D) 1000 UNITS tablet Take 2,000 Units by mouth daily. Reported on 04/22/2015    . famotidine (PEPCID) 10 MG tablet Take 10 mg by mouth as needed for heartburn or indigestion.    . magnesium 30 MG tablet Take 30 mg by mouth 2 (two) times daily.     No current  facility-administered medications on file prior to visit.    Past Medical History:  Diagnosis Date  . Arthritis   . Breast cancer ([place]) 2016   right breast cancer  . Breast cancer, right breast ([place]) 05/17/14   DUCTAL CARCINOMA IN SITU WITH CALCIFICATIONS  . Heart murmur   . History of kidney stones   . Mitral valve prolapse    very mild, per pt.  . Neoplasm of right breast, primary tumor staging category Tis: ductal carcinoma in situ (DCIS) 05/2014  . Personal history of radiation therapy 2016   right breast cancer  . S/P radiation therapy 08/05/2014 through 09/16/2014   Right breast 4800 cGy in 24 sessions, right breast tumor bed boost 1200 cGy in 6 sessions, for a cumulative tumor bed dose of 6000 cGy in 30 sessions    . Shingles     Past Surgical History:  Procedure Laterality Date  . BREAST LUMPECTOMY Right 2016  . BREAST LUMPECTOMY WITH RADIOACTIVE SEED LOCALIZATION Right 07/04/2014   Procedure: BREAST LUMPECTOMY WITH RADIOACTIVE SEED LOCALIZATION;  Surgeon: Rolm Bookbinder, MD;  Location: [place];  Service: General;  Laterality: Right;  . TOTAL KNEE ARTHROPLASTY Right 02/28/2018  . TOTAL KNEE ARTHROPLASTY Right 02/28/2018   Procedure: TOTAL KNEE ARTHROPLASTY;  Surgeon: Melrose Nakayama, MD;  Location: [place];  Service: Orthopedics;  Laterality: Right;  . WISDOM TOOTH EXTRACTION  Social History   Socioeconomic History  . Marital status: Married    Spouse name: Coralyn Mark  . Number of children: 2  . Years of education: Not on file  . Highest education level: Not on file  Occupational History  . Not on file  Tobacco Use  . Smoking status: Never Smoker  . Smokeless tobacco: Never Used  Substance and Sexual Activity  . Alcohol use: Yes    Comment: 1-2 drinks/week  . Drug use: No  . Sexual activity: Yes    Birth control/protection: Post-menopausal  Other Topics Concern  . Not on file  Social History Narrative   Married. Husband  is CEO [employer].       Exercising regularly: walking, elliptical, weights, biking - [employer], yoga   Social Determinants of Health   Financial Resource Strain:   . Difficulty of Paying Living Expenses:   Food Insecurity:   . Worried About Charity fundraiser in the Last Year:   . Arboriculturist in the Last Year:   Transportation Needs:   . Film/video editor (Medical):   Marland Kitchen Lack of Transportation (Non-Medical):   Physical Activity:   . Days of Exercise per Week:   . Minutes of Exercise per Session:   Stress:   . Feeling of Stress :   Social Connections:   . Frequency of Communication with Friends and Family:   . Frequency of Social Gatherings with Friends and Family:   . Attends Religious Services:   . Active Member of Clubs or Organizations:   . Attends Archivist Meetings:   Marland Kitchen Marital Status:     Family History  Problem Relation Age of Onset  . Colon cancer Father 3  . CAD Father 70       CABG x 2 - age 70s, 48s  . Hypertension Father   . Hyperlipidemia Father   . Breast cancer Mother 37       DCIS  . Hypertension Mother   . Cancer Paternal Grandfather 50       spinal tumor  . Breast cancer Other        MGM's sister dx over 64  . Breast cancer Other        MGF's sister dx over 48  . Breast cancer Sister   . CAD Paternal Aunt     Review of Systems  Constitutional: Negative for chills, fatigue and fever.  Musculoskeletal: Negative for arthralgias and back pain.  Skin: Positive for rash. Negative for wound.  Neurological: Negative for headaches.       Objective:   Vitals:   08/22/19 1455  BP: 128/82  Pulse: (!) 56  Resp: 16  Temp: 98.2 F (36.8 C)  SpO2: 97%   BP Readings from Last 3 Encounters:  08/22/19 128/82  03/01/18 107/67  02/15/18 126/82   Wt Readings from Last 3 Encounters:  08/22/19 145 lb (65.8 kg)  02/28/18 158 lb 4.6 oz (71.8 kg)  02/15/18 149 lb 12.8 oz (67.9 kg)   Body mass index is 22.05 kg/m.    Physical Exam Constitutional:      General: She is not in acute distress.    Appearance: Normal appearance. She is not ill-appearing.  HENT:     Head: Normocephalic and atraumatic.  Musculoskeletal:     Right lower leg: No edema.     Left lower leg: No edema.  Skin:    General: Skin is warm and dry.  Comments: Faint circular erythema rash with central clearing. No insect bite or induration.  No other rashes or skin lesion visible  Neurological:     Mental Status: She is alert.            Assessment & Plan:    30 minutes were spent face-to-face with the patient, most of the visit was spent counseling her on her GERD, tick prevention, symptoms, treatment if needed and vitamin d deficiency    See Problem List for Assessment and Plan of chronic medical problems.    This visit occurred during the SARS-CoV-2 public health emergency.  Safety protocols were in place, including screening questions prior to the visit, additional usage of staff PPE, and extensive cleaning of exam room while observing appropriate contact time as indicated for disinfecting solutions.

## 2019-08-22 NOTE — Assessment & Plan Note (Signed)
Chronic Has taken 2000 units daily for 2 months Check vitamin d level

## 2019-08-22 NOTE — Assessment & Plan Note (Signed)
Chronic Having gerd about once a week Taking pepcid as needed - can continue prn but will try to take more preventatively when she knows she is going to eat something that causes symptoms

## 2019-08-22 NOTE — Assessment & Plan Note (Signed)
Acute First noticed a few weeks - has become fainter and resolving w/o treatment - asymptomatic Possible exposure to ticks - will check for lyme , cbc, cmp No treatment needed at this point Continue tick prevention Discussed concerning symptoms to monitor for  - tick borne diseases

## 2019-08-22 NOTE — Patient Instructions (Addendum)
  Blood work was ordered.     Medications reviewed and updated.  Changes include : none       Please call if there is no improvement in your symptoms.    

## 2019-08-23 ENCOUNTER — Encounter: Payer: Self-pay | Admitting: Internal Medicine

## 2019-08-23 LAB — B. BURGDORFI ANTIBODIES: B burgdorferi Ab IgG+IgM: <0.9 index

## 2019-09-20 NOTE — Progress Notes (Signed)
Subjective:    Patient ID: Rebekah Larson, female    DOB: 1965-11-05, 54 y.o.   MRN: [id number]  HPI The patient is here for an acute visit.  ? UTI -yesterday first thing in the morning she had an episode where she felt like she had to go to the bathroom and her bladder was full, but did not go much and was had difficulty urinating.  She drank water and over the next few hours she was able to urinate normally.  She does not have any symptoms today.  She is traveling and wanted to make sure there was no beginnings of an infection or kidney stone.  She does have a history of kidney stones.  She has not had any abdominal or back discomfort, nausea, fever, change in urine color, dysuria or major increase in frequency.   Medications and allergies reviewed with patient and updated if appropriate.  Patient Active Problem List   Diagnosis Date Noted  . Vitamin D deficiency 08/22/2019  . Rash and nonspecific skin eruption 08/22/2019  . GERD (gastroesophageal reflux disease) 08/22/2019  . Primary localized osteoarthritis of right knee 02/28/2018  . Primary osteoarthritis of right knee 02/28/2018  . Low back pain 09/06/2017  . Nonallopathic lesion of lumbosacral region 09/06/2017  . Nonallopathic lesion of sacral region 09/06/2017  . Nonallopathic lesion of thoracic region 09/06/2017  . Degenerative joint disease of knee, right 01/26/2017  . Acute medial meniscal tear, right, initial encounter 01/13/2017  . Chalazion of right lower eyelid 08/23/2016  . Meniscal cyst 06/17/2015  . MVP (mitral valve prolapse) 04/22/2015  . Genetic testing 06/10/2014  . Neoplasm of right breast, primary tumor staging category Tis: ductal carcinoma in situ (DCIS) 05/27/2014  . Osteopenia 10/09/2007    Current Outpatient Medications on File Prior to Visit  Medication Sig Dispense Refill  . cholecalciferol (VITAMIN D) 1000 UNITS tablet Take 2,000 Units by mouth daily. Reported on 04/22/2015    . famotidine  (PEPCID) 10 MG tablet Take 10 mg by mouth as needed for heartburn or indigestion.    . magnesium 30 MG tablet Take 30 mg by mouth 2 (two) times daily.     No current facility-administered medications on file prior to visit.    Past Medical History:  Diagnosis Date  . Arthritis   . Breast cancer ([place]) 2016   right breast cancer  . Breast cancer, right breast ([place]) 05/17/14   DUCTAL CARCINOMA IN SITU WITH CALCIFICATIONS  . Heart murmur   . History of kidney stones   . Mitral valve prolapse    very mild, per pt.  . Neoplasm of right breast, primary tumor staging category Tis: ductal carcinoma in situ (DCIS) 05/2014  . Personal history of radiation therapy 2016   right breast cancer  . S/P radiation therapy 08/05/2014 through 09/16/2014   Right breast 4800 cGy in 24 sessions, right breast tumor bed boost 1200 cGy in 6 sessions, for a cumulative tumor bed dose of 6000 cGy in 30 sessions    . Shingles     Past Surgical History:  Procedure Laterality Date  . BREAST LUMPECTOMY Right 2016  . BREAST LUMPECTOMY WITH RADIOACTIVE SEED LOCALIZATION Right 07/04/2014   Procedure: BREAST LUMPECTOMY WITH RADIOACTIVE SEED LOCALIZATION;  Surgeon: Rolm Bookbinder, MD;  Location: [place];  Service: General;  Laterality: Right;  . TOTAL KNEE ARTHROPLASTY Right 02/28/2018  . TOTAL KNEE ARTHROPLASTY Right 02/28/2018   Procedure: TOTAL KNEE ARTHROPLASTY;  Surgeon: Melrose Nakayama,  MD;  Location: [place];  Service: Orthopedics;  Laterality: Right;  . WISDOM TOOTH EXTRACTION      Social History   Socioeconomic History  . Marital status: Married    Spouse name: Coralyn Mark  . Number of children: 2  . Years of education: Not on file  . Highest education level: Not on file  Occupational History  . Not on file  Tobacco Use  . Smoking status: Never Smoker  . Smokeless tobacco: Never Used  Vaping Use  . Vaping Use: Never used  Substance and Sexual Activity  . Alcohol  use: Yes    Comment: 1-2 drinks/week  . Drug use: No  . Sexual activity: Yes    Birth control/protection: Post-menopausal  Other Topics Concern  . Not on file  Social History Narrative   Married. Husband is CEO [employer].       Exercising regularly: walking, elliptical, weights, biking - [employer], yoga   Social Determinants of Health   Financial Resource Strain:   . Difficulty of Paying Living Expenses:   Food Insecurity:   . Worried About Charity fundraiser in the Last Year:   . Arboriculturist in the Last Year:   Transportation Needs:   . Film/video editor (Medical):   Marland Kitchen Lack of Transportation (Non-Medical):   Physical Activity:   . Days of Exercise per Week:   . Minutes of Exercise per Session:   Stress:   . Feeling of Stress :   Social Connections:   . Frequency of Communication with Friends and Family:   . Frequency of Social Gatherings with Friends and Family:   . Attends Religious Services:   . Active Member of Clubs or Organizations:   . Attends Archivist Meetings:   Marland Kitchen Marital Status:     Family History  Problem Relation Age of Onset  . Colon cancer Father 71  . CAD Father 23       CABG x 2 - age 39s, 9s  . Hypertension Father   . Hyperlipidemia Father   . Breast cancer Mother 1       DCIS  . Hypertension Mother   . Cancer Paternal Grandfather 50       spinal tumor  . Breast cancer Other        MGM's sister dx over 54  . Breast cancer Other        MGF's sister dx over 77  . Breast cancer Sister   . CAD Paternal Aunt     Review of Systems  Constitutional: Negative for chills and fever.  Gastrointestinal: Negative for abdominal pain and nausea.  Genitourinary: Positive for difficulty urinating (Yesterday morning only). Negative for dysuria, frequency and hematuria.  Musculoskeletal: Negative for back pain.       Objective:   Vitals:   09/21/19 1349  BP: 118/78  Pulse: 63  Temp: 98.1 F (36.7 C)  SpO2: 98%   BP  Readings from Last 3 Encounters:  09/21/19 118/78  08/22/19 128/82  03/01/18 107/67   Wt Readings from Last 3 Encounters:  09/21/19 141 lb (64 kg)  08/22/19 145 lb (65.8 kg)  02/28/18 158 lb 4.6 oz (71.8 kg)   Body mass index is 21.44 kg/m.   Physical Exam Constitutional:      General: She is not in acute distress.    Appearance: Normal appearance. She is not ill-appearing.  Abdominal:     General: There is no distension.  Palpations: Abdomen is soft.     Tenderness: There is no abdominal tenderness. There is no right CVA tenderness, left CVA tenderness, guarding or rebound.  Skin:    General: Skin is warm and dry.  Neurological:     Mental Status: She is alert.            Assessment & Plan:    See Problem List for Assessment and Plan of chronic medical problems.    This visit occurred during the SARS-CoV-2 public health emergency.  Safety protocols were in place, including screening questions prior to the visit, additional usage of staff PPE, and extensive cleaning of exam room while observing appropriate contact time as indicated for disinfecting solutions.

## 2019-09-21 ENCOUNTER — Encounter: Payer: Self-pay | Admitting: Internal Medicine

## 2019-09-21 ENCOUNTER — Ambulatory Visit (INDEPENDENT_AMBULATORY_CARE_PROVIDER_SITE_OTHER): Payer: 59 | Admitting: Internal Medicine

## 2019-09-21 ENCOUNTER — Other Ambulatory Visit: Payer: Self-pay

## 2019-09-21 VITALS — BP 118/78 | HR 63 | Temp 98.1°F | Ht 68.0 in | Wt 141.0 lb

## 2019-09-21 DIAGNOSIS — R39198 Other difficulties with micturition: Secondary | ICD-10-CM

## 2019-09-21 DIAGNOSIS — R300 Dysuria: Secondary | ICD-10-CM | POA: Diagnosis not present

## 2019-09-21 LAB — POC URINALSYSI DIPSTICK (AUTOMATED)
Bilirubin, UA: NEGATIVE
Blood, UA: NEGATIVE
Glucose, UA: NEGATIVE
Ketones, UA: NEGATIVE
Leukocytes, UA: NEGATIVE
Nitrite, UA: NEGATIVE
Protein, UA: NEGATIVE
Spec Grav, UA: 1.015 (ref 1.010–1.025)
Urobilinogen, UA: 0.2 E.U./dL
pH, UA: 7 (ref 5.0–8.0)

## 2019-09-21 NOTE — Assessment & Plan Note (Signed)
Acute Difficulty urinating yesterday-with increased fluids symptoms have pretty much resolved-asymptomatic today ?  Beginnings of UTI, renal stone, versus other nonserious urological causes Urine dip here looks benign-we will send for culture She will continue increased fluids If she does have further symptoms we will evaluate further with possible CT Can take Azo over-the-counter as needed if mild symptoms recur now or in the future

## 2019-09-21 NOTE — Patient Instructions (Signed)
We will send your urine for culture.   If your symptoms recur please let me know.

## 2019-09-22 LAB — URINE CULTURE: Result:: NO GROWTH

## 2019-12-07 DIAGNOSIS — Z6822 Body mass index (BMI) 22.0-22.9, adult: Secondary | ICD-10-CM | POA: Diagnosis not present

## 2019-12-07 DIAGNOSIS — Z01419 Encounter for gynecological examination (general) (routine) without abnormal findings: Secondary | ICD-10-CM | POA: Diagnosis not present

## 2019-12-07 DIAGNOSIS — M810 Age-related osteoporosis without current pathological fracture: Secondary | ICD-10-CM | POA: Diagnosis not present

## 2019-12-12 DIAGNOSIS — Z23 Encounter for immunization: Secondary | ICD-10-CM | POA: Diagnosis not present

## 2020-03-13 ENCOUNTER — Other Ambulatory Visit: Payer: 59

## 2020-03-13 DIAGNOSIS — Z20822 Contact with and (suspected) exposure to covid-19: Secondary | ICD-10-CM | POA: Diagnosis not present

## 2020-03-15 LAB — NOVEL CORONAVIRUS, NAA: SARS-CoV-2, NAA: NOT DETECTED

## 2020-03-15 LAB — SARS-COV-2, NAA 2 DAY TAT

## 2020-04-17 ENCOUNTER — Other Ambulatory Visit: Payer: 59

## 2020-04-17 DIAGNOSIS — Z20822 Contact with and (suspected) exposure to covid-19: Secondary | ICD-10-CM | POA: Diagnosis not present

## 2020-04-18 LAB — SARS-COV-2, NAA 2 DAY TAT

## 2020-04-18 LAB — NOVEL CORONAVIRUS, NAA: SARS-CoV-2, NAA: NOT DETECTED

## 2020-04-28 ENCOUNTER — Ambulatory Visit (INDEPENDENT_AMBULATORY_CARE_PROVIDER_SITE_OTHER): Payer: 59 | Admitting: Psychology

## 2020-04-28 DIAGNOSIS — F4322 Adjustment disorder with anxiety: Secondary | ICD-10-CM

## 2020-05-16 ENCOUNTER — Ambulatory Visit (INDEPENDENT_AMBULATORY_CARE_PROVIDER_SITE_OTHER): Payer: 59 | Admitting: Psychology

## 2020-05-16 DIAGNOSIS — F4323 Adjustment disorder with mixed anxiety and depressed mood: Secondary | ICD-10-CM | POA: Diagnosis not present

## 2020-06-05 ENCOUNTER — Ambulatory Visit: Payer: 59 | Admitting: Psychology

## 2020-06-19 ENCOUNTER — Ambulatory Visit: Payer: Self-pay | Admitting: Psychology

## 2020-07-03 ENCOUNTER — Ambulatory Visit (INDEPENDENT_AMBULATORY_CARE_PROVIDER_SITE_OTHER): Payer: 59 | Admitting: Psychology

## 2020-07-03 DIAGNOSIS — F4323 Adjustment disorder with mixed anxiety and depressed mood: Secondary | ICD-10-CM

## 2020-07-17 ENCOUNTER — Ambulatory Visit (INDEPENDENT_AMBULATORY_CARE_PROVIDER_SITE_OTHER): Payer: 59 | Admitting: Psychology

## 2020-07-17 DIAGNOSIS — F4323 Adjustment disorder with mixed anxiety and depressed mood: Secondary | ICD-10-CM | POA: Diagnosis not present

## 2020-08-01 ENCOUNTER — Ambulatory Visit: Payer: 59 | Admitting: Psychology

## 2020-08-15 ENCOUNTER — Ambulatory Visit (INDEPENDENT_AMBULATORY_CARE_PROVIDER_SITE_OTHER): Payer: 59 | Admitting: Psychology

## 2020-08-15 DIAGNOSIS — F4323 Adjustment disorder with mixed anxiety and depressed mood: Secondary | ICD-10-CM | POA: Diagnosis not present

## 2020-08-27 ENCOUNTER — Other Ambulatory Visit: Payer: Self-pay | Admitting: Internal Medicine

## 2020-08-27 DIAGNOSIS — Z1231 Encounter for screening mammogram for malignant neoplasm of breast: Secondary | ICD-10-CM

## 2020-09-02 ENCOUNTER — Telehealth: Payer: Self-pay | Admitting: Pharmacist

## 2020-09-02 ENCOUNTER — Other Ambulatory Visit (HOSPITAL_COMMUNITY): Payer: Self-pay

## 2020-09-02 DIAGNOSIS — Z1211 Encounter for screening for malignant neoplasm of colon: Secondary | ICD-10-CM | POA: Diagnosis not present

## 2020-09-02 DIAGNOSIS — K219 Gastro-esophageal reflux disease without esophagitis: Secondary | ICD-10-CM | POA: Diagnosis not present

## 2020-09-02 DIAGNOSIS — Z800 Family history of malignant neoplasm of digestive organs: Secondary | ICD-10-CM | POA: Diagnosis not present

## 2020-09-02 MED ORDER — PROLIA 60 MG/ML ~~LOC~~ SOSY
60.0000 mg | PREFILLED_SYRINGE | SUBCUTANEOUS | 1 refills | Status: DC
  Filled 2020-09-02 – 2020-09-03 (×2): qty 1, 180d supply, fill #0

## 2020-09-02 NOTE — Telephone Encounter (Signed)
Called patient to schedule an appointment for the [facility]. I was unable to reach the patient so I left a HIPAA-compliant message requesting that the patient return my call.   Luke Van Ausdall, PharmD, BCACP, CPP Clinical Pharmacist [facility] [phone]  

## 2020-09-03 ENCOUNTER — Other Ambulatory Visit (HOSPITAL_COMMUNITY): Payer: Self-pay

## 2020-09-03 MED ORDER — CLENPIQ 10-3.5-12 MG-GM -GM/160ML PO SOLN
ORAL | 0 refills | Status: AC
  Filled 2020-09-03: qty 320, 1d supply, fill #0

## 2020-09-05 ENCOUNTER — Other Ambulatory Visit (HOSPITAL_COMMUNITY): Payer: Self-pay

## 2020-09-05 MED ORDER — PROLIA 60 MG/ML ~~LOC~~ SOSY
PREFILLED_SYRINGE | SUBCUTANEOUS | 1 refills | Status: DC
  Filled 2020-09-05: qty 1, 90d supply, fill #0

## 2020-09-08 ENCOUNTER — Other Ambulatory Visit (HOSPITAL_COMMUNITY): Payer: Self-pay

## 2020-09-08 DIAGNOSIS — Z96651 Presence of right artificial knee joint: Secondary | ICD-10-CM | POA: Diagnosis not present

## 2020-09-08 DIAGNOSIS — M1712 Unilateral primary osteoarthritis, left knee: Secondary | ICD-10-CM | POA: Diagnosis not present

## 2020-09-11 ENCOUNTER — Other Ambulatory Visit (HOSPITAL_COMMUNITY): Payer: Self-pay

## 2020-09-11 ENCOUNTER — Other Ambulatory Visit: Payer: Self-pay

## 2020-09-11 ENCOUNTER — Other Ambulatory Visit: Payer: Self-pay | Admitting: Pharmacist

## 2020-09-11 ENCOUNTER — Ambulatory Visit: Payer: 59 | Attending: Internal Medicine | Admitting: Pharmacist

## 2020-09-11 DIAGNOSIS — Z79899 Other long term (current) drug therapy: Secondary | ICD-10-CM

## 2020-09-11 MED ORDER — PROLIA 60 MG/ML ~~LOC~~ SOSY
PREFILLED_SYRINGE | SUBCUTANEOUS | 1 refills | Status: AC
  Filled 2020-09-11: qty 1, 1d supply, fill #0
  Filled 2020-09-11: qty 1, 180d supply, fill #0
  Filled 2020-09-11: qty 1, fill #0
  Filled 2021-04-21: qty 1, 180d supply, fill #1

## 2020-09-11 MED ORDER — PROLIA 60 MG/ML ~~LOC~~ SOSY
PREFILLED_SYRINGE | SUBCUTANEOUS | 1 refills | Status: DC
  Filled 2020-09-11: qty 1, 30d supply, fill #0

## 2020-09-11 NOTE — Progress Notes (Signed)
S:  Patient presents for review of their specialty medication therapy.  Patient is currently taking Prolia for osteoporosis. Patient is managed by Dr. Helane Rima for this.   Adherence: reported. She is coming up on her third or fourth dose.   Efficacy: believes this is working well. Due for a BMD scan soon.   Dosing: 60 mg subq q42months  Dose adjustments: Renal: Monitor patients with severe impairment (CrCl <30 mL/minute or on dialysis) closely, as significant and prolonged hypocalcemia (incidence of 29% and potentially lasting weeks to months) and marked elevations of serum parathyroid hormone are serious risks in this population. Ensure adequate calcium and vitamin D intake/supplementation. CrCl ?30 mL/minute: No dosage adjustment necessary. CrCl <30 mL/minute: No dosage adjustment necessary; use in conjunction with guidance from patient's nephrology team. Hepatic: no dose adjustments (has not been studied)  Drug-drug interactions:none identified  Screening: TB test: completed Hepatitis: completed   Monitoring: S/sx of infection: none S/sx of hypersensitivity: none S/sx of hypocalcemia/hypercalcemia: none Dermatitis/skin rash: none Peripheral edema: none WC:HENI GI upset:  none  Other side effects: none  Last bone density study: pt does not recall. This is not available in [facility].    O:      Lab Results  Component Value Date   WBC 7.9 08/22/2019   HGB 13.5 08/22/2019   HCT 39.1 08/22/2019   MCV 93.1 08/22/2019   PLT 188.0 08/22/2019      Chemistry      Component Value Date/Time   NA 139 08/22/2019 1551   K 3.7 08/22/2019 1551   CL 106 08/22/2019 1551   CO2 27 08/22/2019 1551   BUN 18 08/22/2019 1551   CREATININE 0.67 08/22/2019 1551      Component Value Date/Time   CALCIUM 9.0 08/22/2019 1551   ALKPHOS 42 08/22/2019 1551   AST 16 08/22/2019 1551   ALT 12 08/22/2019 1551   BILITOT 0.6 08/22/2019 1551       A/P: 1. Medication review: Patient currently on  Prolia for osteoporosis. Reviewed the medication with the patient, including the following: Prolia (denosumab) is a monoclonal antibody with affinity for nuclear factor-kappa ligand (RANKL). Prolia binds to RANKL and prevents osteoclast formation, leading to decreased bone resorption and increased bone mass in osteoporosis. Patient educated on purpose, proper use, and potential adverse effects of Prolia. The most common adverse effects are hypersensitivities, peripheral edema, dermatitis/skin rash, GI upset, HA, joint pain, and infection. There is the possibility of atypical femur fracture, serum calcium disturbances, and osteonecrosis of the jaw. Patients should monitor for and report hip, thigh, or groin pain. Additionally, patients should monitor for and report jaw pain, tooth/periodontal infection, toothache, and/or gingival ulceration/erosion. Prolia exists as a solution prefilled syringe for SQ administration. Administration: Denosumab is intended for SubQ route only and should not be administered IV, IM, or intradermally. Prior to administration, bring to room temperature in original container (allow to stand ~15 to 30 minutes); do not warm by any other method. Solution may contain trace amounts of translucent to white protein particles; do not use if cloudy, discolored (normal solution should be clear and colorless to pale yellow), or contains excessive particles or foreign matter. Avoid vigorous shaking. Administer via SubQ injection in the upper arm, upper thigh, or abdomen; should only be administered by a health care professional. No recommendations for any changes at this time.   Benard Halsted, PharmD, [facility] [place], [place] [phone]

## 2020-09-12 ENCOUNTER — Other Ambulatory Visit (HOSPITAL_COMMUNITY): Payer: Self-pay

## 2020-09-15 ENCOUNTER — Ambulatory Visit
Admission: RE | Admit: 2020-09-15 | Discharge: 2020-09-15 | Disposition: A | Discharge status: Home / Self Care | Payer: 59 | Source: Ambulatory Visit | Attending: Internal Medicine | Admitting: Internal Medicine

## 2020-09-15 ENCOUNTER — Other Ambulatory Visit: Payer: Self-pay

## 2020-09-15 DIAGNOSIS — Z1231 Encounter for screening mammogram for malignant neoplasm of breast: Secondary | ICD-10-CM | POA: Diagnosis not present

## 2020-09-15 DIAGNOSIS — Z853 Personal history of malignant neoplasm of breast: Secondary | ICD-10-CM | POA: Diagnosis not present

## 2020-09-15 DIAGNOSIS — M810 Age-related osteoporosis without current pathological fracture: Secondary | ICD-10-CM | POA: Diagnosis not present

## 2020-09-17 DIAGNOSIS — Z1211 Encounter for screening for malignant neoplasm of colon: Secondary | ICD-10-CM | POA: Diagnosis not present

## 2020-09-17 DIAGNOSIS — Z800 Family history of malignant neoplasm of digestive organs: Secondary | ICD-10-CM | POA: Diagnosis not present

## 2020-09-17 LAB — HM COLONOSCOPY

## 2020-10-02 ENCOUNTER — Encounter: Payer: Self-pay | Admitting: Internal Medicine

## 2020-10-02 NOTE — Progress Notes (Unsigned)
Outside notes received. Information abstracted. Notes sent to scan.  

## 2020-12-17 DIAGNOSIS — M1712 Unilateral primary osteoarthritis, left knee: Secondary | ICD-10-CM | POA: Diagnosis not present

## 2020-12-17 DIAGNOSIS — Z0189 Encounter for other specified special examinations: Secondary | ICD-10-CM | POA: Diagnosis not present

## 2020-12-17 DIAGNOSIS — N951 Menopausal and female climacteric states: Secondary | ICD-10-CM | POA: Diagnosis not present

## 2020-12-17 DIAGNOSIS — K2100 Gastro-esophageal reflux disease with esophagitis, without bleeding: Secondary | ICD-10-CM | POA: Diagnosis not present

## 2020-12-17 DIAGNOSIS — Z8619 Personal history of other infectious and parasitic diseases: Secondary | ICD-10-CM | POA: Diagnosis not present

## 2020-12-17 DIAGNOSIS — Z87442 Personal history of urinary calculi: Secondary | ICD-10-CM | POA: Diagnosis not present

## 2020-12-17 DIAGNOSIS — Z86000 Personal history of in-situ neoplasm of breast: Secondary | ICD-10-CM | POA: Diagnosis not present

## 2020-12-25 DIAGNOSIS — Z96651 Presence of right artificial knee joint: Secondary | ICD-10-CM | POA: Diagnosis not present

## 2020-12-25 DIAGNOSIS — M1712 Unilateral primary osteoarthritis, left knee: Secondary | ICD-10-CM | POA: Diagnosis not present

## 2020-12-25 DIAGNOSIS — Z9889 Other specified postprocedural states: Secondary | ICD-10-CM | POA: Diagnosis not present

## 2021-01-28 DIAGNOSIS — J019 Acute sinusitis, unspecified: Secondary | ICD-10-CM | POA: Diagnosis not present

## 2021-03-02 ENCOUNTER — Other Ambulatory Visit (HOSPITAL_COMMUNITY): Payer: Self-pay

## 2021-03-04 ENCOUNTER — Other Ambulatory Visit (HOSPITAL_COMMUNITY): Payer: Self-pay

## 2021-03-09 ENCOUNTER — Other Ambulatory Visit (HOSPITAL_COMMUNITY): Payer: Self-pay

## 2021-03-19 DIAGNOSIS — M1712 Unilateral primary osteoarthritis, left knee: Secondary | ICD-10-CM | POA: Diagnosis not present

## 2021-04-02 ENCOUNTER — Other Ambulatory Visit (HOSPITAL_COMMUNITY): Payer: Self-pay

## 2021-04-10 DIAGNOSIS — M1712 Unilateral primary osteoarthritis, left knee: Secondary | ICD-10-CM | POA: Diagnosis not present

## 2021-04-17 DIAGNOSIS — Z5181 Encounter for therapeutic drug level monitoring: Secondary | ICD-10-CM | POA: Diagnosis not present

## 2021-04-17 DIAGNOSIS — E559 Vitamin D deficiency, unspecified: Secondary | ICD-10-CM | POA: Diagnosis not present

## 2021-04-21 ENCOUNTER — Other Ambulatory Visit (HOSPITAL_COMMUNITY): Payer: Self-pay

## 2021-05-07 DIAGNOSIS — L821 Other seborrheic keratosis: Secondary | ICD-10-CM | POA: Diagnosis not present

## 2021-05-07 DIAGNOSIS — D239 Other benign neoplasm of skin, unspecified: Secondary | ICD-10-CM | POA: Diagnosis not present

## 2021-05-07 DIAGNOSIS — D229 Melanocytic nevi, unspecified: Secondary | ICD-10-CM | POA: Diagnosis not present

## 2021-05-20 DIAGNOSIS — Z791 Long term (current) use of non-steroidal anti-inflammatories (NSAID): Secondary | ICD-10-CM | POA: Diagnosis not present

## 2021-05-20 DIAGNOSIS — C801 Malignant (primary) neoplasm, unspecified: Secondary | ICD-10-CM | POA: Diagnosis not present

## 2021-05-20 DIAGNOSIS — Z923 Personal history of irradiation: Secondary | ICD-10-CM | POA: Diagnosis not present

## 2021-05-20 DIAGNOSIS — Z853 Personal history of malignant neoplasm of breast: Secondary | ICD-10-CM | POA: Diagnosis not present

## 2021-05-20 DIAGNOSIS — F419 Anxiety disorder, unspecified: Secondary | ICD-10-CM | POA: Diagnosis not present

## 2021-05-20 DIAGNOSIS — Z79899 Other long term (current) drug therapy: Secondary | ICD-10-CM | POA: Diagnosis not present

## 2021-05-20 DIAGNOSIS — Z01818 Encounter for other preprocedural examination: Secondary | ICD-10-CM | POA: Diagnosis not present

## 2021-05-20 DIAGNOSIS — M179 Osteoarthritis of knee, unspecified: Secondary | ICD-10-CM | POA: Diagnosis not present

## 2021-05-20 DIAGNOSIS — M1712 Unilateral primary osteoarthritis, left knee: Secondary | ICD-10-CM | POA: Diagnosis not present

## 2021-05-20 DIAGNOSIS — K219 Gastro-esophageal reflux disease without esophagitis: Secondary | ICD-10-CM | POA: Diagnosis not present

## 2021-06-22 DIAGNOSIS — G8918 Other acute postprocedural pain: Secondary | ICD-10-CM | POA: Diagnosis not present

## 2021-06-22 DIAGNOSIS — M1712 Unilateral primary osteoarthritis, left knee: Secondary | ICD-10-CM | POA: Diagnosis not present

## 2021-06-26 DIAGNOSIS — Z96652 Presence of left artificial knee joint: Secondary | ICD-10-CM | POA: Diagnosis not present

## 2021-06-29 DIAGNOSIS — Z96652 Presence of left artificial knee joint: Secondary | ICD-10-CM | POA: Diagnosis not present

## 2021-07-06 DIAGNOSIS — Z96652 Presence of left artificial knee joint: Secondary | ICD-10-CM | POA: Diagnosis not present

## 2021-07-09 DIAGNOSIS — Z96652 Presence of left artificial knee joint: Secondary | ICD-10-CM | POA: Diagnosis not present

## 2021-07-13 DIAGNOSIS — Z96652 Presence of left artificial knee joint: Secondary | ICD-10-CM | POA: Diagnosis not present

## 2021-07-16 DIAGNOSIS — Z96652 Presence of left artificial knee joint: Secondary | ICD-10-CM | POA: Diagnosis not present

## 2021-07-20 DIAGNOSIS — Z96652 Presence of left artificial knee joint: Secondary | ICD-10-CM | POA: Diagnosis not present

## 2021-07-23 DIAGNOSIS — Z96652 Presence of left artificial knee joint: Secondary | ICD-10-CM | POA: Diagnosis not present

## 2021-07-27 DIAGNOSIS — Z96652 Presence of left artificial knee joint: Secondary | ICD-10-CM | POA: Diagnosis not present

## 2021-07-30 DIAGNOSIS — Z96652 Presence of left artificial knee joint: Secondary | ICD-10-CM | POA: Diagnosis not present

## 2021-07-31 DIAGNOSIS — Z96652 Presence of left artificial knee joint: Secondary | ICD-10-CM | POA: Diagnosis not present

## 2021-08-03 DIAGNOSIS — Z96652 Presence of left artificial knee joint: Secondary | ICD-10-CM | POA: Diagnosis not present

## 2021-08-05 DIAGNOSIS — Z23 Encounter for immunization: Secondary | ICD-10-CM | POA: Diagnosis not present

## 2021-08-05 DIAGNOSIS — Z7184 Encounter for health counseling related to travel: Secondary | ICD-10-CM | POA: Diagnosis not present

## 2021-08-13 DIAGNOSIS — Z96652 Presence of left artificial knee joint: Secondary | ICD-10-CM | POA: Diagnosis not present

## 2021-08-19 DIAGNOSIS — Z96652 Presence of left artificial knee joint: Secondary | ICD-10-CM | POA: Diagnosis not present

## 2021-09-03 DIAGNOSIS — Z96652 Presence of left artificial knee joint: Secondary | ICD-10-CM | POA: Diagnosis not present

## 2021-09-10 DIAGNOSIS — Z23 Encounter for immunization: Secondary | ICD-10-CM | POA: Diagnosis not present

## 2021-09-10 DIAGNOSIS — D0510 Intraductal carcinoma in situ of unspecified breast: Secondary | ICD-10-CM | POA: Diagnosis not present

## 2021-09-10 DIAGNOSIS — M810 Age-related osteoporosis without current pathological fracture: Secondary | ICD-10-CM | POA: Diagnosis not present

## 2021-09-10 DIAGNOSIS — Z803 Family history of malignant neoplasm of breast: Secondary | ICD-10-CM | POA: Diagnosis not present

## 2021-09-10 DIAGNOSIS — Z01419 Encounter for gynecological examination (general) (routine) without abnormal findings: Secondary | ICD-10-CM | POA: Diagnosis not present

## 2021-09-11 DIAGNOSIS — R928 Other abnormal and inconclusive findings on diagnostic imaging of breast: Secondary | ICD-10-CM | POA: Diagnosis not present

## 2021-09-15 ENCOUNTER — Telehealth: Payer: Self-pay | Admitting: Pharmacist

## 2021-09-18 ENCOUNTER — Other Ambulatory Visit (HOSPITAL_COMMUNITY): Payer: Self-pay

## 2021-09-18 DIAGNOSIS — Z96652 Presence of left artificial knee joint: Secondary | ICD-10-CM | POA: Diagnosis not present

## 2021-09-24 DIAGNOSIS — D242 Benign neoplasm of left breast: Secondary | ICD-10-CM | POA: Diagnosis not present

## 2021-09-24 DIAGNOSIS — N6323 Unspecified lump in the left breast, lower outer quadrant: Secondary | ICD-10-CM | POA: Diagnosis not present

## 2021-09-24 DIAGNOSIS — N6092 Unspecified benign mammary dysplasia of left breast: Secondary | ICD-10-CM | POA: Diagnosis not present

## 2021-10-15 ENCOUNTER — Other Ambulatory Visit (HOSPITAL_COMMUNITY): Payer: Self-pay

## 2021-10-19 ENCOUNTER — Other Ambulatory Visit (HOSPITAL_COMMUNITY): Payer: Self-pay

## 2021-10-20 ENCOUNTER — Other Ambulatory Visit (HOSPITAL_COMMUNITY): Payer: Self-pay

## 2021-10-22 ENCOUNTER — Other Ambulatory Visit (HOSPITAL_COMMUNITY): Payer: Self-pay

## 2021-10-23 ENCOUNTER — Other Ambulatory Visit (HOSPITAL_COMMUNITY): Payer: Self-pay

## 2021-11-02 DIAGNOSIS — Z780 Asymptomatic menopausal state: Secondary | ICD-10-CM | POA: Diagnosis not present

## 2021-11-02 DIAGNOSIS — M8589 Other specified disorders of bone density and structure, multiple sites: Secondary | ICD-10-CM | POA: Diagnosis not present

## 2021-11-02 DIAGNOSIS — Z13820 Encounter for screening for osteoporosis: Secondary | ICD-10-CM | POA: Diagnosis not present

## 2021-11-04 ENCOUNTER — Other Ambulatory Visit (HOSPITAL_COMMUNITY): Payer: Self-pay

## 2021-11-04 DIAGNOSIS — H9203 Otalgia, bilateral: Secondary | ICD-10-CM | POA: Diagnosis not present

## 2021-11-04 DIAGNOSIS — Z6821 Body mass index (BMI) 21.0-21.9, adult: Secondary | ICD-10-CM | POA: Diagnosis not present

## 2021-11-04 DIAGNOSIS — M8580 Other specified disorders of bone density and structure, unspecified site: Secondary | ICD-10-CM | POA: Diagnosis not present

## 2021-11-05 ENCOUNTER — Other Ambulatory Visit (HOSPITAL_COMMUNITY): Payer: Self-pay

## 2021-11-06 ENCOUNTER — Other Ambulatory Visit (HOSPITAL_COMMUNITY): Payer: Self-pay

## 2021-11-11 DIAGNOSIS — N6082 Other benign mammary dysplasias of left breast: Secondary | ICD-10-CM | POA: Diagnosis not present

## 2021-11-11 DIAGNOSIS — Z170 Estrogen receptor positive status [ER+]: Secondary | ICD-10-CM | POA: Diagnosis not present

## 2021-11-11 DIAGNOSIS — D0511 Intraductal carcinoma in situ of right breast: Secondary | ICD-10-CM | POA: Diagnosis not present

## 2021-11-17 DIAGNOSIS — Z1371 Encounter for nonprocreative screening for genetic disease carrier status: Secondary | ICD-10-CM | POA: Diagnosis not present

## 2021-11-17 DIAGNOSIS — D0511 Intraductal carcinoma in situ of right breast: Secondary | ICD-10-CM | POA: Diagnosis not present

## 2021-11-17 DIAGNOSIS — N6082 Other benign mammary dysplasias of left breast: Secondary | ICD-10-CM | POA: Diagnosis not present

## 2021-11-17 DIAGNOSIS — Z803 Family history of malignant neoplasm of breast: Secondary | ICD-10-CM | POA: Diagnosis not present

## 2021-11-17 DIAGNOSIS — Z800 Family history of malignant neoplasm of digestive organs: Secondary | ICD-10-CM | POA: Diagnosis not present

## 2021-12-01 DIAGNOSIS — Z170 Estrogen receptor positive status [ER+]: Secondary | ICD-10-CM | POA: Diagnosis not present

## 2021-12-01 DIAGNOSIS — D0511 Intraductal carcinoma in situ of right breast: Secondary | ICD-10-CM | POA: Diagnosis not present

## 2021-12-01 DIAGNOSIS — Z803 Family history of malignant neoplasm of breast: Secondary | ICD-10-CM | POA: Diagnosis not present

## 2021-12-01 DIAGNOSIS — Z800 Family history of malignant neoplasm of digestive organs: Secondary | ICD-10-CM | POA: Diagnosis not present

## 2021-12-02 DIAGNOSIS — M8580 Other specified disorders of bone density and structure, unspecified site: Secondary | ICD-10-CM | POA: Diagnosis not present

## 2021-12-07 DIAGNOSIS — Z96652 Presence of left artificial knee joint: Secondary | ICD-10-CM | POA: Diagnosis not present

## 2021-12-17 DIAGNOSIS — Z1371 Encounter for nonprocreative screening for genetic disease carrier status: Secondary | ICD-10-CM | POA: Diagnosis not present

## 2021-12-31 DIAGNOSIS — N6082 Other benign mammary dysplasias of left breast: Secondary | ICD-10-CM | POA: Diagnosis not present

## 2022-01-24 IMAGING — MG DIGITAL DIAGNOSTIC BILAT W/ TOMO W/ CAD
6 of 10 series · 6 of 26 positions shown · non-contrast
Comparison: Previous exam(s).

CLINICAL DATA: History of right breast cancer status post
lumpectomy in 6060

EXAM:
DIGITAL DIAGNOSTIC BILATERAL MAMMOGRAM WITH CAD AND TOMO

[R MLO (1 of 2)]
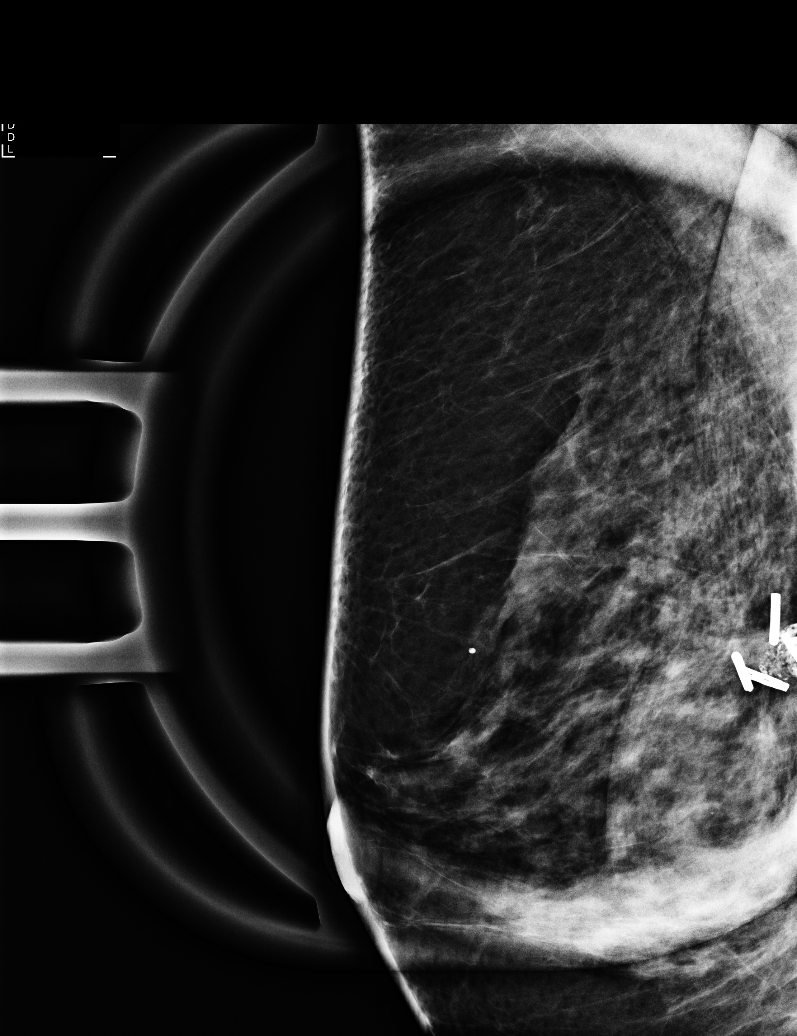

[R MLO (2 of 2)]
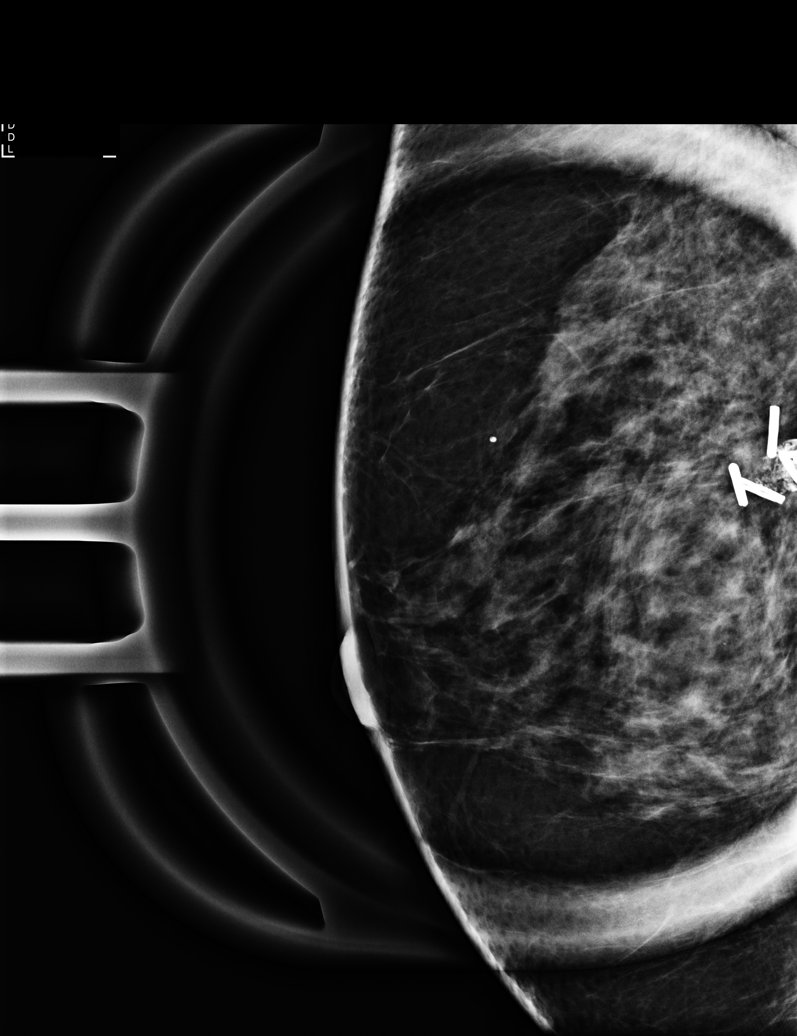

[L CC synth-2D]
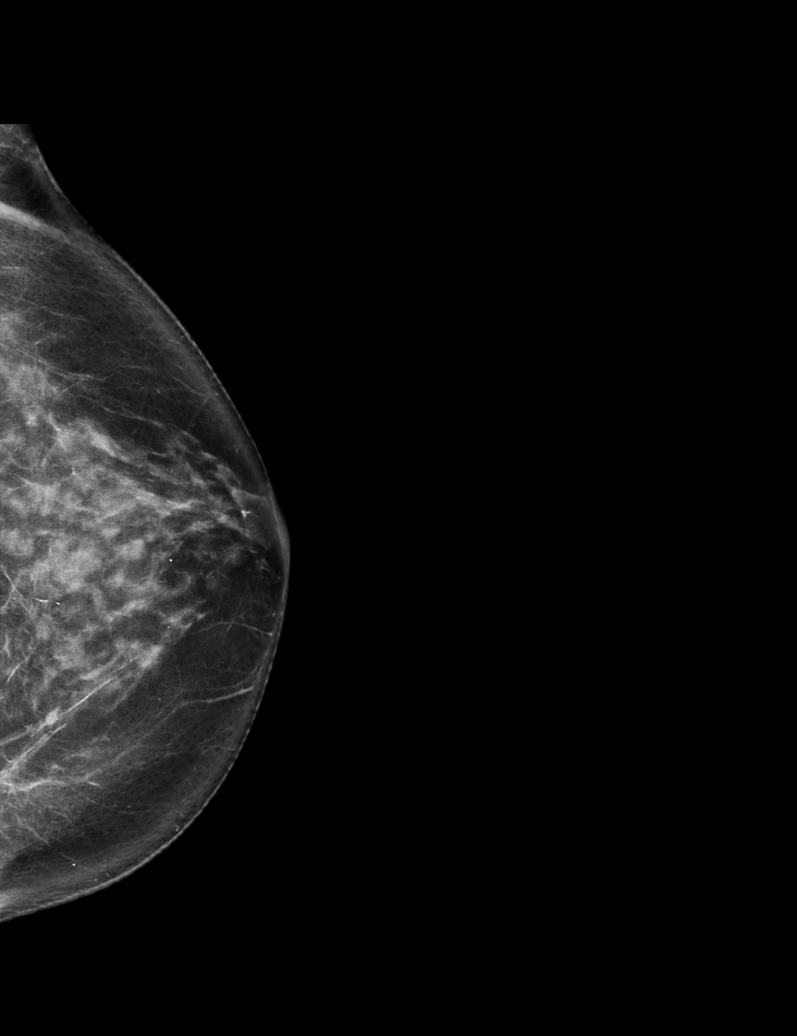

[R CC synth-2D]
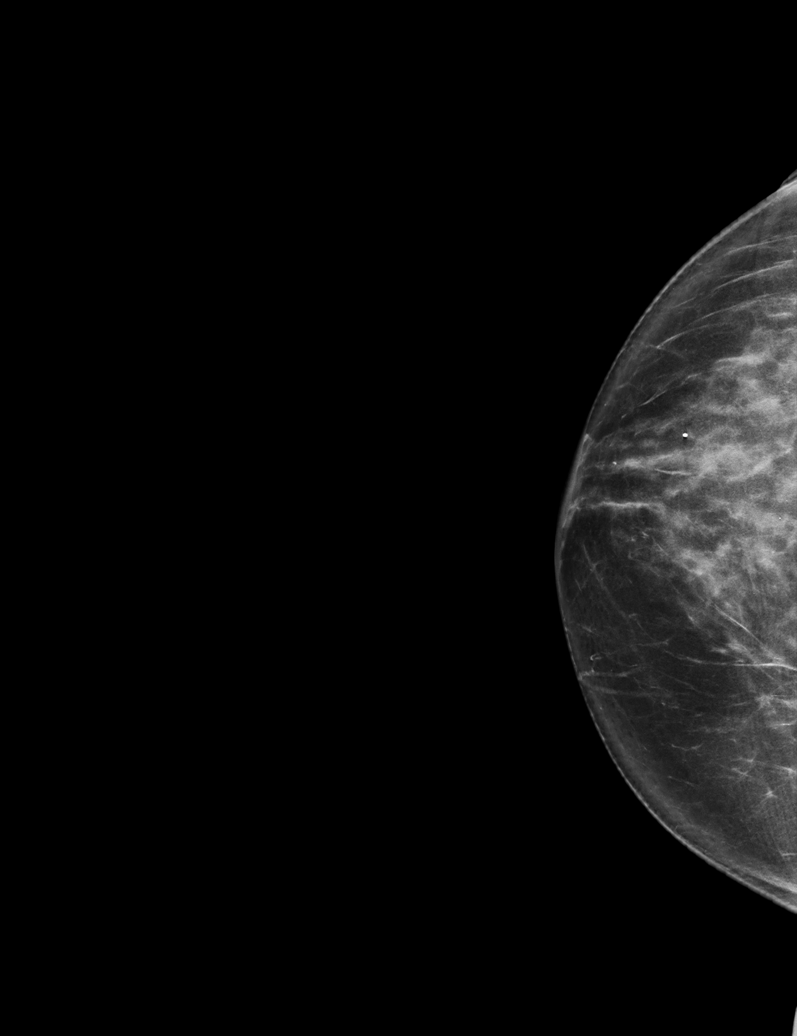

[L MLO synth-2D]
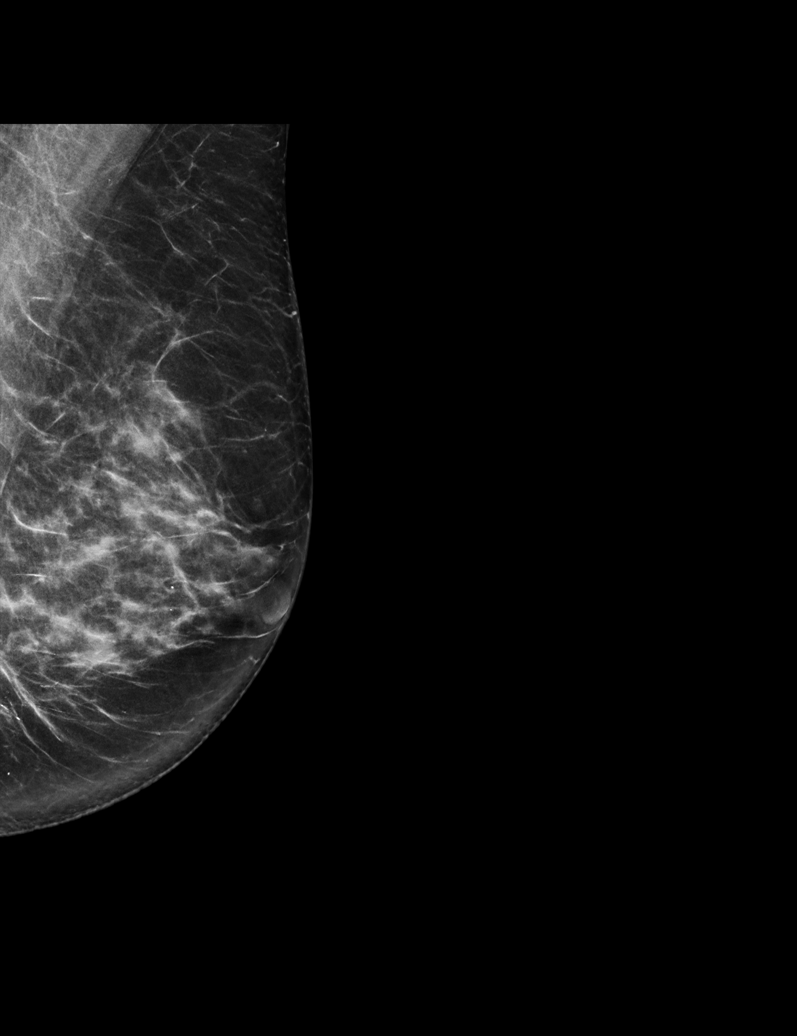

[R MLO synth-2D]
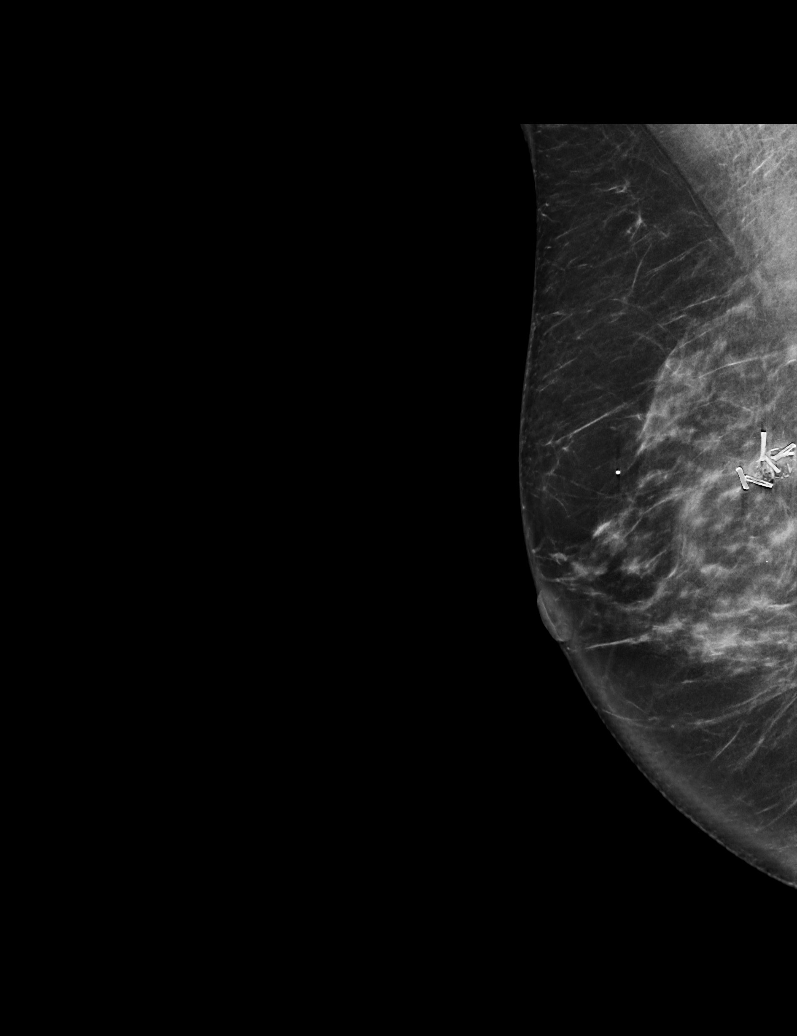

[6 of 26 positions shown; findings below may reference images not displayed]

ACR Breast Density Category c: The breast tissue is heterogeneously
dense, which may obscure small masses.
FINDINGS: Lumpectomy changes are seen in the right breast. No suspicious mass
or malignant type microcalcifications identified in either breast.

Mammographic images were processed with CAD.
IMPRESSION: No evidence of malignancy in either breast.

RECOMMENDATION:
Bilateral screening mammogram in 1 year is recommended.

I have discussed the findings and recommendations with the patient.
If applicable, a reminder letter will be sent to the patient
regarding the next appointment.

BI-RADS CATEGORY  2: Benign.

## 2022-02-24 DIAGNOSIS — D242 Benign neoplasm of left breast: Secondary | ICD-10-CM | POA: Diagnosis not present

## 2022-02-24 DIAGNOSIS — N6012 Diffuse cystic mastopathy of left breast: Secondary | ICD-10-CM | POA: Diagnosis not present

## 2022-02-24 DIAGNOSIS — R921 Mammographic calcification found on diagnostic imaging of breast: Secondary | ICD-10-CM | POA: Diagnosis not present

## 2022-02-24 DIAGNOSIS — N6320 Unspecified lump in the left breast, unspecified quadrant: Secondary | ICD-10-CM | POA: Diagnosis not present

## 2022-02-24 DIAGNOSIS — N6092 Unspecified benign mammary dysplasia of left breast: Secondary | ICD-10-CM | POA: Diagnosis not present

## 2022-02-24 DIAGNOSIS — R011 Cardiac murmur, unspecified: Secondary | ICD-10-CM | POA: Diagnosis not present

## 2022-02-24 DIAGNOSIS — N6032 Fibrosclerosis of left breast: Secondary | ICD-10-CM | POA: Diagnosis not present

## 2022-03-09 DIAGNOSIS — Z23 Encounter for immunization: Secondary | ICD-10-CM | POA: Diagnosis not present

## 2022-03-24 DIAGNOSIS — R059 Cough, unspecified: Secondary | ICD-10-CM | POA: Diagnosis not present

## 2022-03-26 ENCOUNTER — Other Ambulatory Visit: Payer: Self-pay

## 2022-08-31 DIAGNOSIS — Z8249 Family history of ischemic heart disease and other diseases of the circulatory system: Secondary | ICD-10-CM | POA: Diagnosis not present

## 2022-08-31 DIAGNOSIS — Z131 Encounter for screening for diabetes mellitus: Secondary | ICD-10-CM | POA: Diagnosis not present

## 2022-08-31 DIAGNOSIS — Z2839 Other underimmunization status: Secondary | ICD-10-CM | POA: Diagnosis not present

## 2022-08-31 DIAGNOSIS — M8580 Other specified disorders of bone density and structure, unspecified site: Secondary | ICD-10-CM | POA: Diagnosis not present

## 2022-08-31 DIAGNOSIS — Z79899 Other long term (current) drug therapy: Secondary | ICD-10-CM | POA: Diagnosis not present

## 2022-08-31 DIAGNOSIS — J301 Allergic rhinitis due to pollen: Secondary | ICD-10-CM | POA: Diagnosis not present

## 2022-08-31 DIAGNOSIS — Z23 Encounter for immunization: Secondary | ICD-10-CM | POA: Diagnosis not present

## 2022-08-31 DIAGNOSIS — N6092 Unspecified benign mammary dysplasia of left breast: Secondary | ICD-10-CM | POA: Diagnosis not present

## 2022-08-31 DIAGNOSIS — Z13220 Encounter for screening for lipoid disorders: Secondary | ICD-10-CM | POA: Diagnosis not present

## 2022-08-31 DIAGNOSIS — R42 Dizziness and giddiness: Secondary | ICD-10-CM | POA: Diagnosis not present

## 2022-09-20 DIAGNOSIS — Z853 Personal history of malignant neoplasm of breast: Secondary | ICD-10-CM | POA: Diagnosis not present

## 2022-09-27 DIAGNOSIS — Z170 Estrogen receptor positive status [ER+]: Secondary | ICD-10-CM | POA: Diagnosis not present

## 2022-09-27 DIAGNOSIS — D0511 Intraductal carcinoma in situ of right breast: Secondary | ICD-10-CM | POA: Diagnosis not present

## 2022-09-27 DIAGNOSIS — N6082 Other benign mammary dysplasias of left breast: Secondary | ICD-10-CM | POA: Diagnosis not present

## 2022-09-29 ENCOUNTER — Other Ambulatory Visit: Payer: Self-pay

## 2022-09-29 ENCOUNTER — Other Ambulatory Visit (HOSPITAL_COMMUNITY): Payer: Self-pay

## 2022-09-30 ENCOUNTER — Other Ambulatory Visit: Payer: Self-pay

## 2022-09-30 DIAGNOSIS — Z96652 Presence of left artificial knee joint: Secondary | ICD-10-CM | POA: Diagnosis not present

## 2022-11-12 ENCOUNTER — Encounter (HOSPITAL_COMMUNITY): Payer: Self-pay

## 2022-11-12 ENCOUNTER — Other Ambulatory Visit: Payer: Self-pay

## 2022-11-12 ENCOUNTER — Other Ambulatory Visit (HOSPITAL_COMMUNITY): Payer: Self-pay

## 2022-11-12 MED ORDER — ESTRADIOL 0.1 MG/GM VA CREA
0.5000 | TOPICAL_CREAM | VAGINAL | 3 refills | Status: AC
  Filled 2022-11-12 – 2022-11-13 (×2): qty 42.5, 90d supply, fill #0

## 2022-11-13 ENCOUNTER — Other Ambulatory Visit (HOSPITAL_COMMUNITY): Payer: Self-pay
